# Patient Record
Sex: Male | Born: 1980 | Race: White | Hispanic: No | Marital: Married | Smoking: Never smoker
Health system: Southern US, Community
[De-identification: ages and names within clinical notes are randomized; demographics above are authoritative.]

## PROBLEM LIST (undated history)

## (undated) DIAGNOSIS — G43909 Migraine, unspecified, not intractable, without status migrainosus: Secondary | ICD-10-CM

## (undated) DIAGNOSIS — E349 Endocrine disorder, unspecified: Secondary | ICD-10-CM

## (undated) HISTORY — DX: Endocrine disorder, unspecified: E34.9

## (undated) HISTORY — DX: Migraine, unspecified, not intractable, without status migrainosus: G43.909

## (undated) HISTORY — PX: VASECTOMY: SHX75

---

## 2016-09-13 ENCOUNTER — Other Ambulatory Visit: Payer: Self-pay | Admitting: Neurology

## 2016-09-13 DIAGNOSIS — G43711 Chronic migraine without aura, intractable, with status migrainosus: Secondary | ICD-10-CM

## 2016-09-27 ENCOUNTER — Ambulatory Visit
Admission: RE | Admit: 2016-09-27 | Discharge: 2016-09-27 | Disposition: A | Discharge status: Home / Self Care | Payer: 59 | Source: Ambulatory Visit | Attending: Neurology | Admitting: Neurology

## 2016-09-27 DIAGNOSIS — G43711 Chronic migraine without aura, intractable, with status migrainosus: Secondary | ICD-10-CM | POA: Insufficient documentation

## 2016-09-27 MED ORDER — GADOBENATE DIMEGLUMINE 529 MG/ML IV SOLN
20.0000 mL | Freq: Once | INTRAVENOUS | Status: AC | PRN
  Administered 2016-09-27: 20 mL via INTRAVENOUS

## 2018-08-10 IMAGING — MR MR HEAD WO/W CM
12 series · 48 of 48 positions shown · IV contrast (20mL MULTIHANCE)
Comparison: None.

CLINICAL DATA: Intractable chronic migraine headache without [place]

EXAM:
MRI HEAD WITHOUT AND WITH CONTRAST
TECHNIQUE: Multiplanar, multiecho pulse sequences of the brain and surrounding
structures were obtained without and with intravenous contrast.
CONTRAST:  20mL MULTIHANCE GADOBENATE DIMEGLUMINE 529 MG/ML IV SOLN

[Series 2: T1 · sagittal · 5.0mm · 0.45mm/px · 1 of 27 slices shown (1 of 2)]
[im 1/27]
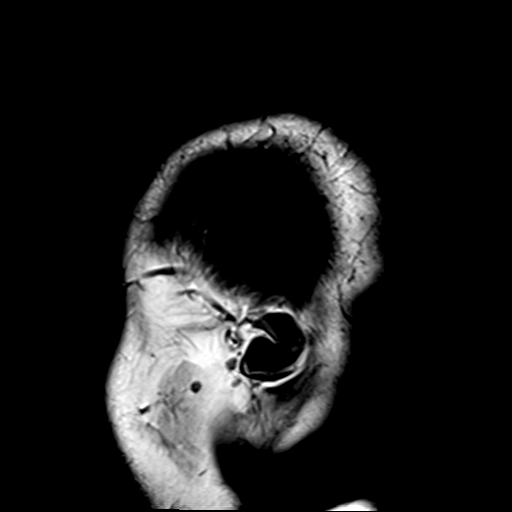

[Series 4: DWI · axial · 3.0mm · 1.80mm/px · z∈[-62,+100]mm · 4 of 55 slices shown (1 of 2)]
[im 1/55]
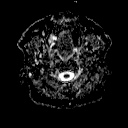
[im 19/55]
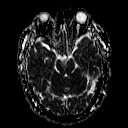
[im 37/55]
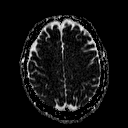
[im 55/55]
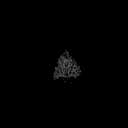

[Series 6: DWI · coronal · 3.0mm · 1.80mm/px · 3 of 46 slices shown (2 of 2)]
[im 1/46]
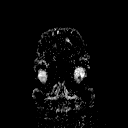
[im 23/46]
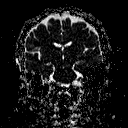
[im 46/46]
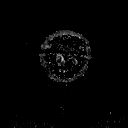

[Series 7: T2 · axial · 5.0mm · 0.60mm/px · z∈[-59,+97]mm · 2 of 25 slices shown (1 of 2)]
[im 1/25]
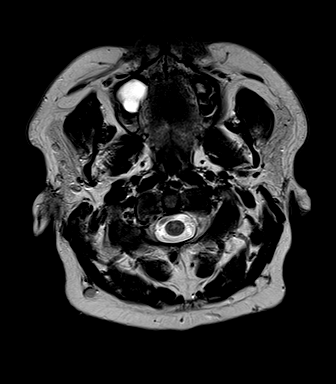
[im 25/25]
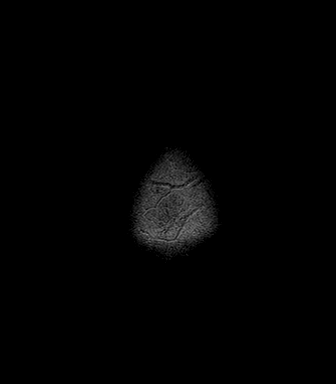

[Series 8: FLAIR · axial · 3.0mm · 0.45mm/px · z∈[-59,+97]mm · 3 of 53 slices shown]
[im 1/53]
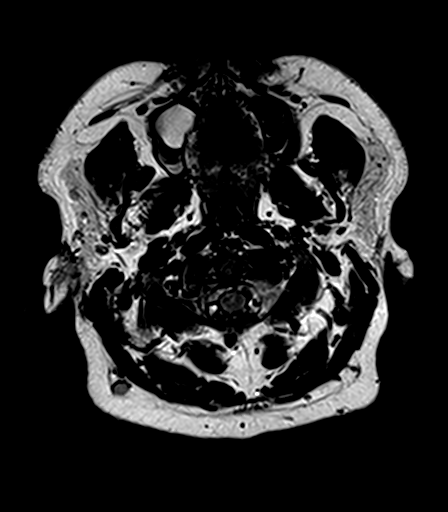
[im 27/53]
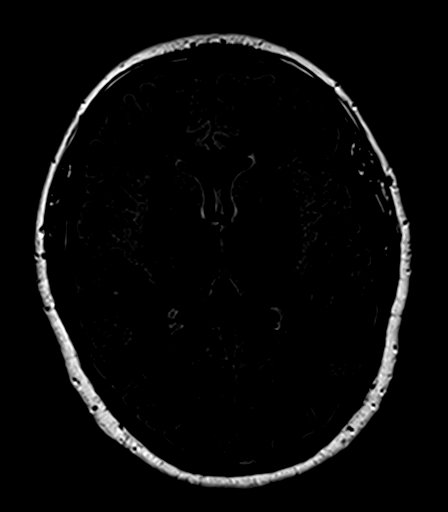
[im 53/53]
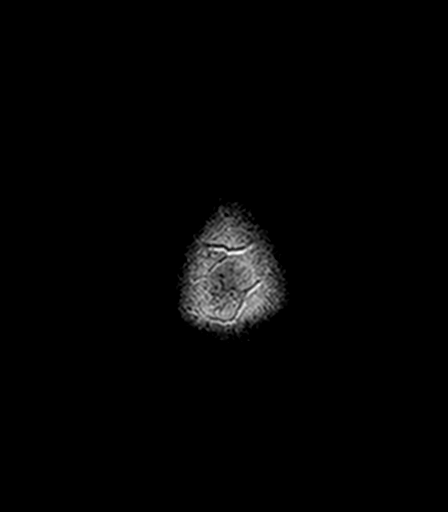

[Series 9: T1 · axial · 1.0mm · 1.00mm/px · z∈[-68,+107]mm · 11 of 176 slices shown (2 of 2)]
[im 1/176]
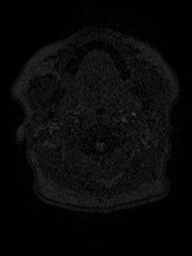
[im 18/176]
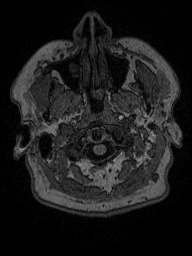
[im 36/176]
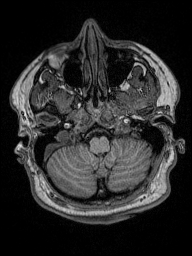
[im 53/176]
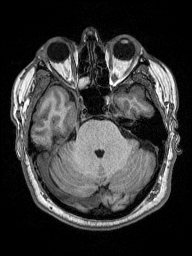
[im 71/176]
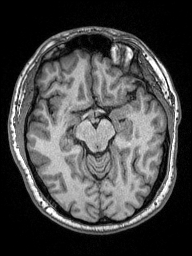
[im 88/176]
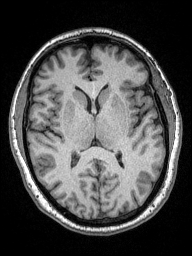
[im 106/176]
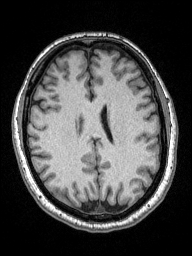
[im 123/176]
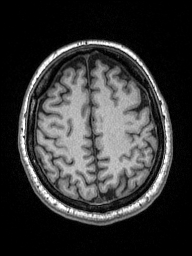
[im 141/176]
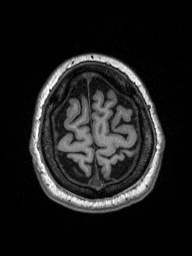
[im 158/176]
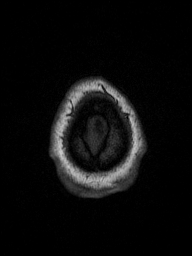
[im 176/176]
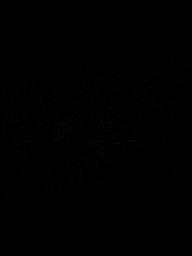

[Series 10: T2 · axial · 5.0mm · 0.45mm/px · z∈[-59,+97]mm · 2 of 25 slices shown (2 of 2)]
[im 1/25]
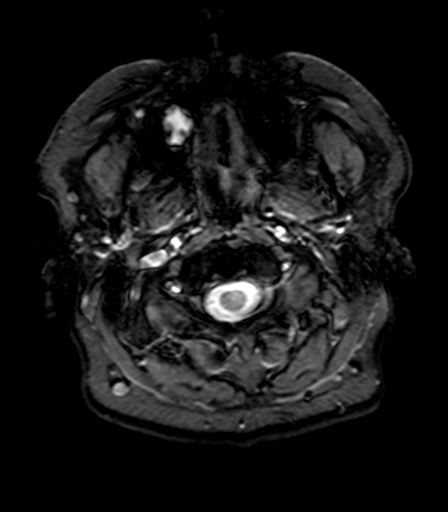
[im 25/25]
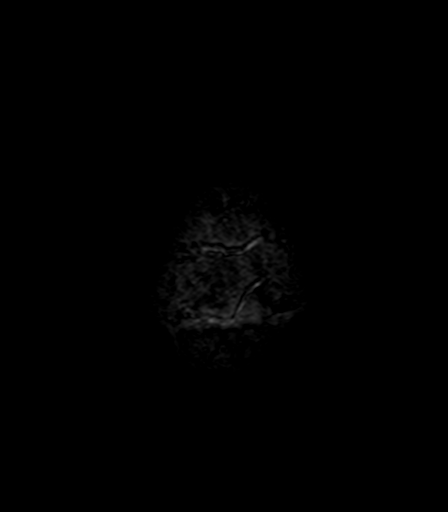

[Series 11: T2 post-contrast · coronal · 5.0mm · 0.49mm/px · 2 of 29 slices shown]
[im 1/29]
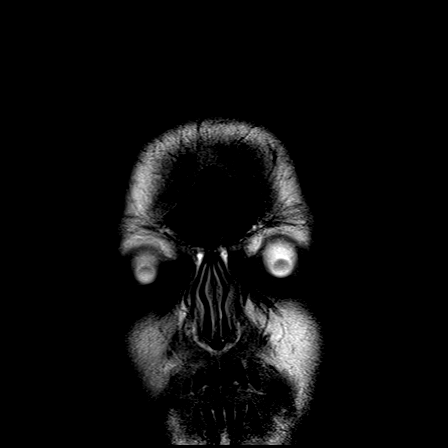
[im 29/29]
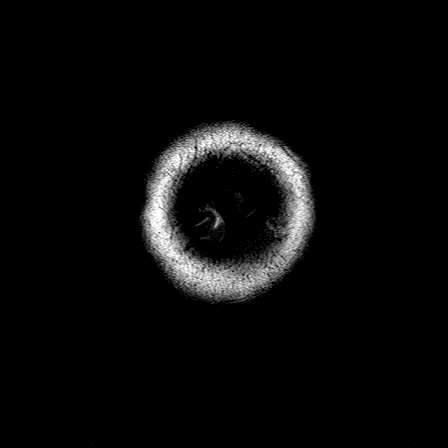

[Series 12: T1 post-contrast · axial · 1.0mm · 1.00mm/px · z∈[-68,+107]mm · 11 of 176 slices shown (1 of 2)]
[im 1/176]
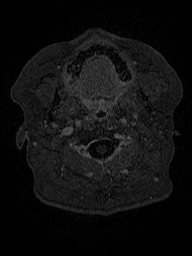
[im 18/176]
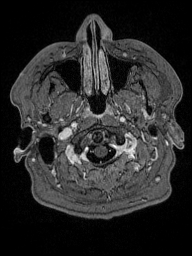
[im 36/176]
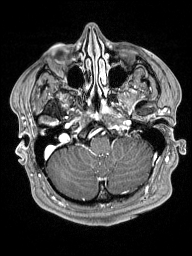
[im 53/176]
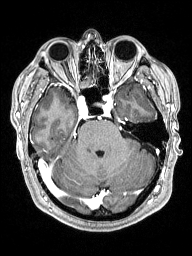
[im 71/176]
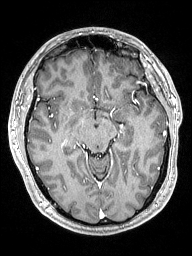
[im 88/176]
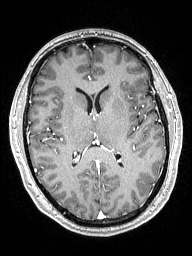
[im 106/176]
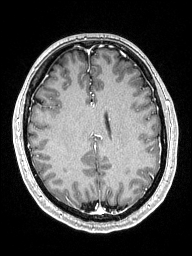
[im 123/176]
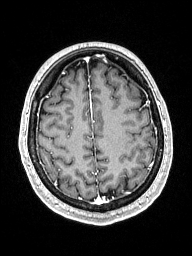
[im 141/176]
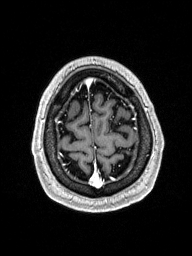
[im 158/176]
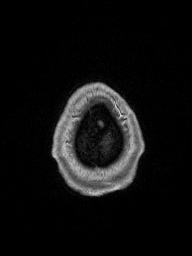
[im 176/176]
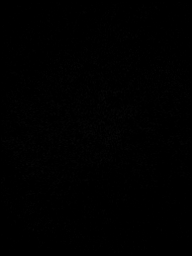

[Series 13: T1 post-contrast · coronal · 5.0mm · 0.43mm/px · 2 of 29 slices shown (2 of 2)]
[im 1/29]
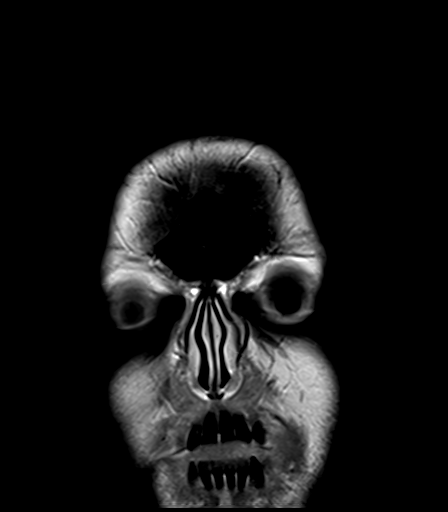
[im 29/29]
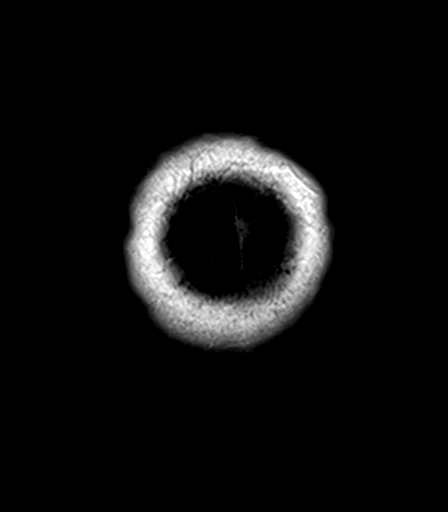

[Series 100: (id) · axial · 3.0mm · 1.80mm/px · z∈[-62,+100]mm · 4 of 55 slices shown]
[im 1/55]
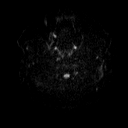
[im 19/55]
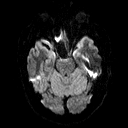
[im 37/55]
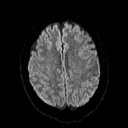
[im 55/55]
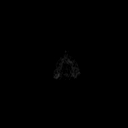

[Series 101: cor (id) · coronal · 3.0mm · 1.80mm/px · 3 of 47 slices shown]
[im 1/47]
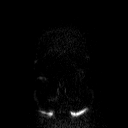
[im 24/47]
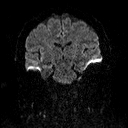
[im 47/47]
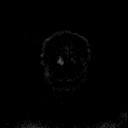

[48 of 48 positions shown; findings below may reference images not displayed]

FINDINGS: Brain: Ventricle size and cerebral volume normal. Negative for acute
or chronic infarction. Normal white matter. Negative for hemorrhage
or mass.

Normal enhancement postcontrast infusion.

Vascular: Normal arterial flow voids.  Normal venous enhancement.

Skull and upper cervical spine: Negative

Sinuses/Orbits: Mild mucosal edema right ethmoid sinus. Normal
orbit.

Other: None
IMPRESSION: Normal MRI of the brain with contrast

Mucosal edema right ethmoid sinus.

## 2020-12-14 ENCOUNTER — Encounter: Payer: Self-pay | Admitting: Urology

## 2020-12-14 ENCOUNTER — Ambulatory Visit: Payer: BC Managed Care – PPO | Admitting: Urology

## 2020-12-14 ENCOUNTER — Other Ambulatory Visit: Payer: Self-pay

## 2020-12-14 VITALS — BP 129/84 | HR 80 | Ht 69.0 in | Wt 230.0 lb

## 2020-12-14 DIAGNOSIS — Z3009 Encounter for other general counseling and advice on contraception: Secondary | ICD-10-CM

## 2020-12-14 MED ORDER — DIAZEPAM 5 MG PO TABS: 5.0000 mg | ORAL_TABLET | Freq: Once | ORAL | 0 refills | Status: DC | PRN

## 2020-12-14 NOTE — Progress Notes (Signed)
12/14/20 3:12 PM   Bobby Carter [birth year] [id number]  CC: Discuss vasectomy  HPI: I saw Bobby Carter in urology clinic today for evaluation of vasectomy.  He is a healthy 40 year old male with 2 children ages 67 and 49 and he does not desire any further biologic pregnancies.  He denies any family history of prostate cancer.   Social History:  reports that he has never smoked. He has never used smokeless tobacco. He reports current alcohol use. He reports that he does not use drugs.  Physical Exam: BP 129/84   Pulse 80   Ht 5\' 9"  (1.753 m)   Wt 230 lb (104.3 kg)   BMI 33.97 kg/m    Constitutional:  Alert and oriented, No acute distress. Cardiovascular: No clubbing, cyanosis, or edema. Respiratory: Normal respiratory effort, no increased work of breathing. GI: Abdomen is soft, nontender, nondistended, no abdominal masses GU: Circumcised phallus with patent meatus, no lesions, testicles 20 cc and descended bilaterally without masses, vas deferens easily palpable bilaterally   Assessment & Plan:   Healthy 40 year old male who desires vasectomy for permanent sterilization.  We discussed the risks and benefits of vasectomy at length.  Vasectomy is intended to be a permanent form of contraception, and does not produce immediate sterility.  Following vasectomy another form of contraception is required until vas occlusion is confirmed by a post-vasectomy semen analysis obtained 2-3 months after the procedure.  Even after vas occlusion is confirmed, vasectomy is not 100% reliable in preventing pregnancy, and the failure rate is approximately 07/1998.  Repeat vasectomy is required in less than 1% of patients.  He should refrain from ejaculation for 1 week after vasectomy.  Options for fertility after vasectomy include vasectomy reversal, and sperm retrieval with in vitro fertilization or ICSI.  These options are not always successful and may be expensive.  Finally, there are other permanent and  non-permanent alternatives to vasectomy available. There is no risk of erectile dysfunction, and the volume of semen will be similar to prior, as the majority of the ejaculate is from the prostate and seminal vesicles.   The procedure takes ~20 minutes.  We recommend patients take 5-10 mg of Valium 30 minutes prior, and he will need a driver post-procedure.  Local anesthetic is injected into the scrotal skin and a small segment of the vas deferens is removed, and the ends occluded. The complication rate is approximately 1-2%, and includes bleeding, infection, and development of chronic scrotal pain.  PLAN: Schedule vasectomy Valium sent to pharmacy   With Legrand Rams, MD 12/14/2020  [facility] [address], [address] [place], [place] [address] [phone]

## 2020-12-14 NOTE — Patient Instructions (Signed)

## 2021-01-18 ENCOUNTER — Ambulatory Visit: Payer: BC Managed Care – PPO | Admitting: Urology

## 2021-01-18 ENCOUNTER — Other Ambulatory Visit: Payer: Self-pay

## 2021-01-18 ENCOUNTER — Encounter: Payer: Self-pay | Admitting: Urology

## 2021-01-18 VITALS — BP 161/98 | HR 108 | Ht 69.0 in | Wt 230.0 lb

## 2021-01-18 DIAGNOSIS — Z302 Encounter for sterilization: Secondary | ICD-10-CM | POA: Diagnosis not present

## 2021-01-18 NOTE — Patient Instructions (Signed)
Vasectomy, Care After This sheet gives you information about how to care for yourself after your procedure. Your health care provider may also give you more specific instructions. If you have problems or questions, contact your health careprovider. What can I expect after the procedure? After the procedure, it is common to have: Mild pain, swelling, or discomfort in your scrotum or redness on your scrotum. Some blood coming from your incisions or puncture sites for 1 or 2 days. Blood in your semen. Follow these instructions at home: Medicines Take over-the-counter and prescription medicines only as told by your health care provider. Avoid taking any medicines that contain aspirin or NSAIDs, such as ibuprofen. These medicines can make bleeding worse. Activity For the first 2 days after surgery, avoid physical activity and exercise that requires a lot of energy. Ask your health care provider what activities are safe for you. Do not take part in sports or perform heavy physical labor until your pain has improved, or until your health care provider says it is okay. You may have limits on the amount of weight you can lift as told by your health care provider. Do not ejaculate for at least 1 week after the procedure, or for as long as you are told. You may resume sexual activity 7-10 days after your procedure, or when your health care provider approves. Use a different method of birth control (contraception) until you have had test results that confirm that there is no sperm in your semen. Scrotal support Use scrotal support, such as a jockstrap or underwear with a supportive pouch, as needed for 1 week after your procedure. If you feel discomfort in your scrotum, you may remove the scrotal support to see if the discomfort is relieved. Sometimes scrotal support can press on the scrotum and cause or worsen discomfort. If your skin gets irritated, you may add some germ-free (sterile), fluffed bandages or  a clean washcloth to the scrotal support. Managing pain and swelling If directed, put ice on the affected area. To do this: Put ice in a plastic bag. Place a towel between your skin and the bag. Leave the ice on for 20 minutes, 2-3 times a day. Remove the ice if your skin turns bright red. This is very important. If you cannot feel pain, heat, or cold, you have a greater risk of damage to the area.  General instructions  Check your incisions or puncture sites every day for signs of infection. Check for: Redness, swelling, or more pain. Fluid or blood. Warmth. Pus or a bad smell. Leave stitches (sutures) in place. The sutures will dissolve on their own and do not need to be removed. Keep all follow-up visits. This is important because you will need a test to confirm that there is no sperm in your semen. Multiple ejaculations are needed to clear out sperm that were beyond the vasectomy site. You will need one test result showing that there is no sperm in your semen before you can resume unprotected sex. This may take 2-4 months after your procedure. If you were given a sedative during the procedure, it can affect you for several hours. Do not drive or operate machinery until your health care provider says that it is safe.  Contact a health care provider if: You have redness, swelling, or more pain around an incision or puncture site, or in your scrotum area. You have bleeding from an incision or puncture site. You have pus or a bad smell coming from an incision   or puncture site. You have a fever. An incision or puncture site opens up. Get help right away if: You develop a rash. You have trouble breathing. Summary After your procedure, it is common to have mild pain, swelling, redness, or discomfort in your scrotum. For the first 2 days after surgery, avoid physical activity and exercise that requires a lot of energy. Put ice on the affected area. Leave the ice on for 20 minutes, 2-3  times a day. If you were given a sedative during the procedure, it can affect you for several hours. Do not drive or operate machinery until your health care provider says that it is safe. This information is not intended to replace advice given to you by your health care provider. Make sure you discuss any questions you have with your healthcare provider. Document Revised: 11/12/2019 Document Reviewed: 11/12/2019 Elsevier Patient Education  2022 [employer] Inc[employer]  

## 2021-01-18 NOTE — Progress Notes (Signed)
VASECTOMY PROCEDURE NOTE:  The patient was taken to the minor procedure room and placed in the supine position. His genitals were prepped and draped in the usual sterile fashion. The right vas deferens was brought up to the skin of the right upper scrotum. The skin overlying it was anesthetized with 1% lidocaine without epinephrine, anesthetic was also injected alongside the vas deferens in the direction of the inguinal canal. The no scalpel vasectomy instrument was used to make a small perforation in the scrotal skin. The vasectomy clamp was used to grasp the vas deferens. It was carefully dissected free from surrounding structures. A 1cm segment of the vas was removed, and the cut ends of the mucosa were cauterized.  No significant bleeding was noted. The vas deferens was returned to the scrotum. The skin incision was closed with a simple interrupted stitch of 4-0 chromic.  Attention was then turned to the left side. The left vasectomy was performed in the same exact fashion. Sterile dressings were placed over each incision. The patient tolerated the procedure well.  IMPRESSION/DIAGNOSIS: The patient is a 40 year old gentleman who underwent a vasectomy today. Post-procedure instructions were reviewed. I stressed the importance of continuing to use birth control until he provides a semen specimen more than 2 months from now that demonstrates azoospermia.  We discussed return precautions including fever over 101, significant bleeding or hematoma, or uncontrolled pain. I also stressed the importance of avoiding strenuous activity for one week, no sexual activity or ejaculations for 5 days, intermittent icing over the next 48 hours, and scrotal support.   PLAN: The patient will be advised of his semen analysis results when available.  Legrand Rams, MD 01/18/2021

## 2021-04-12 ENCOUNTER — Other Ambulatory Visit: Payer: Self-pay

## 2021-04-12 ENCOUNTER — Other Ambulatory Visit: Payer: BC Managed Care – PPO

## 2021-04-12 DIAGNOSIS — R5383 Other fatigue: Secondary | ICD-10-CM | POA: Diagnosis not present

## 2021-04-13 LAB — TESTOSTERONE: Testosterone: 175 ng/dL — ABNORMAL LOW (ref 264–916)

## 2021-04-14 ENCOUNTER — Other Ambulatory Visit: Payer: Self-pay

## 2021-04-14 ENCOUNTER — Other Ambulatory Visit: Payer: BC Managed Care – PPO

## 2021-04-14 DIAGNOSIS — R7989 Other specified abnormal findings of blood chemistry: Secondary | ICD-10-CM | POA: Diagnosis not present

## 2021-04-14 NOTE — Addendum Note (Signed)
Addended by: Veneta Penton on: 04/14/2021 08:52 AM   Modules accepted: Orders

## 2021-04-15 LAB — TESTOSTERONE: Testosterone: 228 ng/dL — ABNORMAL LOW (ref 264–916)

## 2021-04-15 LAB — PSA: Prostate Specific Ag, Serum: 0.6 ng/mL (ref 0.0–4.0)

## 2021-04-15 LAB — LUTEINIZING HORMONE: LH: 2.3 m[IU]/mL (ref 1.7–8.6)

## 2021-04-15 LAB — HEMOGLOBIN AND HEMATOCRIT, BLOOD
Hematocrit: 42.4 % (ref 37.5–51.0)
Hemoglobin: 13.7 g/dL (ref 13.0–17.7)

## 2021-04-20 NOTE — Progress Notes (Signed)
04/21/2021 9:08 AM   Bobby Carter July 28, 1980 [id number]  Referring provider: No referring provider defined for this encounter.  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 male who presents today for low testosterone.  He has not noticed any improvement with diet and exercise in fatigue or weight loss after eighteen months.    He has no urinary complaints at this time.  Patient denies any modifying or aggravating factors.  Patient denies any gross hematuria, dysuria or suprapubic/flank pain.  Patient denies any fevers, chills, nausea or vomiting.    IPSS     Row Name 04/21/21 0800         International Prostate Symptom Score   How often have you had the sensation of not emptying your bladder? Not at All     How often have you had to urinate less than every two hours? Less than 1 in 5 times     How often have you found you stopped and started again several times when you urinated? Not at All     How often have you found it difficult to postpone urination? Not at All     How often have you had a weak urinary stream? Less than 1 in 5 times     How often have you had to strain to start urination? Not at All     How many times did you typically get up at night to urinate? None     Total IPSS Score 2           Quality of Life due to urinary symptoms   If you were to spend the rest of your life with your urinary condition just the way it is now how would you feel about that? Delighted              Score:  1-7 Mild 8-19 Moderate 20-35 Severe     Patient still having spontaneous erections on occasions.  He denies any pain or curvature with erections.     SHIM     Row Name 04/21/21 [phone]         SHIM: Over the last 6 months:   How do you rate your confidence that you could get and keep an erection? Very High      When you had erections with sexual stimulation, how often were your erections hard enough for penetration (entering your partner)? Almost Always or Always     During sexual intercourse, how often were you able to maintain your erection after you had penetrated (entered) your partner? Almost Always or Always     During sexual intercourse, how difficult was it to maintain your erection to completion of intercourse? Not Difficult     When you attempted sexual intercourse, how often was it satisfactory for you? Almost Always or Always           SHIM Total Score   SHIM 25              Score: 1-7 Severe ED 8-11 Moderate ED 12-16 Mild-Moderate ED 17-21 Mild ED 22-25 No ED        PMH: History reviewed. No pertinent past medical history.  Surgical History: Past Surgical History:  Procedure Laterality Date   VASECTOMY      Home Medications:  Allergies as of 04/21/2021   No  Known Allergies      Medication List        Accurate as of April 21, 2021  9:08 AM. If you have any questions, ask your nurse or doctor.          STOP taking these medications    diazepam 5 MG tablet Commonly known as: Valium Stopped by: Michiel Cowboy, PA-C       TAKE these medications    Testosterone 20.25 MG/1.25GM (1.62%) Gel Place 2 Act onto the skin daily. Started by: Michiel Cowboy, PA-C        Allergies: No Known Allergies  Family History: History reviewed. No pertinent family history.  Social History:  reports that he has never smoked. He has never used smokeless tobacco. He reports current alcohol use. He reports that he does not use drugs.  ROS: Pertinent ROS in HPI  Physical Exam: BP (!) 149/83   Pulse 68   Ht 5\' 9"  (1.753 m)   Wt 230 lb (104.3 kg)   BMI 33.97 kg/m   Constitutional:  Well nourished. Alert and oriented, No acute distress. HEENT: [place] AT, mask in place.  Trachea midline Cardiovascular: No clubbing, cyanosis, or edema. Respiratory:  Normal respiratory effort, no increased work of breathing. Neurologic: Grossly intact, no focal deficits, moving all 4 extremities. Psychiatric: Normal mood and affect.  Laboratory Data: Lab Results  Component Value Date   HGB 13.7 04/14/2021   HCT 42.4 04/14/2021   Component     Latest Ref Rng & Units 04/12/2021 04/14/2021  Testosterone     264 - 916 ng/dL 562 (L) 130 (L)   Component     Latest Ref Rng & Units 04/14/2021  Prostate Specific Ag, Serum     0.0 - 4.0 ng/mL 0.6  I have reviewed the labs.   Pertinent Imaging: CLINICAL DATA:  Intractable chronic migraine headache without aura   EXAM: MRI HEAD WITHOUT AND WITH CONTRAST   TECHNIQUE: Multiplanar, multiecho pulse sequences of the brain and surrounding structures were obtained without and with intravenous contrast.   CONTRAST:  20mL MULTIHANCE GADOBENATE DIMEGLUMINE 529 MG/ML IV SOLN   COMPARISON:  None.   FINDINGS: Brain: Ventricle size and cerebral volume normal. Negative for acute or chronic infarction. Normal white matter. Negative for hemorrhage or mass.   Normal enhancement postcontrast infusion.   Vascular: Normal arterial flow voids.  Normal venous enhancement.   Skull and upper cervical spine: Negative   Sinuses/Orbits: Mild mucosal edema right ethmoid sinus. Normal orbit.   Other: None   IMPRESSION: Normal MRI of the brain with contrast   Mucosal edema right ethmoid sinus.     Electronically Signed   By: Marlan Palau M.D.   On: 09/27/2016 10:51    Assessment & Plan:    1. Testosterone deficiency -met criteria of current guidelines from the AUA reports the diagnosis of hypogonadism requires a morning serum total testosterone level at least 2 days apart that is below 300 ng/dL - If the testosterones levels return below 300 ng/dL we will need to do additional blood work [facility] - if that returns below normal or low normal we will need to add a prolactin) - if that blood work is abnormal - we  will need to refer to endocrinology - Advised the patient that low testosterone levels is a risk factor for CVD -Potential side effects of testosterone replacement were discussed including stimulation of benign prostatic growth with lower urinary tract symptoms; erythrocytosis; edema; gynecomastia; worsening sleep apnea; venous thromboembolism;  testicular atrophy and infertility. Recent studies suggesting an increased incidence of heart attack and stroke in patients taking testosterone was discussed. He was informed there is conflicting evidence regarding the impact of testosterone therapy on cardiovascular risk. The theoretical risk of growth stimulation of an undetected prostate cancer was also discussed.  He was informed that current evidence does not provide any definitive answers regarding the risks of testosterone therapy on prostate cancer and cardiovascular disease. The need for periodic monitoring of his testosterone level, PSA, hematocrit and DRE was discussed. - I also discussed that some men have had success using clomid for hypogonadism.  It does seem to be more successful in younger men, but there are incidences of good results in middle-aged men.  I explained that it is used in male infertility to stimulate the testicles to make more testosterone/sperm.  There has been no long term data on side effects, but some urologists has been having success with this medication.  -Significant symptoms -Recommend starting TRT -We discussed the most common forms of replacement including intramuscular injection and gels and he desires to start gel -discussed transference issues with gel  -Rx for gel sent to pharmacy Meds ordered this encounter  Medications   Testosterone 20.25 MG/1.25GM (1.62%) GEL    Sig: Place 2 Act onto the skin daily.    Dispense:  175 g    Refill:  3    Order Specific Question:   Supervising Provider    Answer:   Vanna Scotland [[id number]]    -Follow-up 4 weeks after starting  TRT for testosterone level and symptom check    2. Undesired fertility  -patient brought in post-vas specimen today   Return in about 1 month (around 05/22/2021) for Testosterone level only.  These notes generated with voice recognition software. I apologize for typographical errors.  Michiel Cowboy, PA-C  [facility] [address]  [address] [place], [place] [address] [phone]

## 2021-04-21 ENCOUNTER — Ambulatory Visit (INDEPENDENT_AMBULATORY_CARE_PROVIDER_SITE_OTHER): Payer: BC Managed Care – PPO | Admitting: Urology

## 2021-04-21 ENCOUNTER — Other Ambulatory Visit: Payer: Self-pay

## 2021-04-21 ENCOUNTER — Other Ambulatory Visit: Payer: BC Managed Care – PPO

## 2021-04-21 ENCOUNTER — Encounter: Payer: Self-pay | Admitting: Urology

## 2021-04-21 VITALS — BP 149/83 | HR 68 | Ht 69.0 in | Wt 230.0 lb

## 2021-04-21 DIAGNOSIS — Z302 Encounter for sterilization: Secondary | ICD-10-CM | POA: Diagnosis not present

## 2021-04-21 DIAGNOSIS — E349 Endocrine disorder, unspecified: Secondary | ICD-10-CM | POA: Diagnosis not present

## 2021-04-21 MED ORDER — TESTOSTERONE 20.25 MG/1.25GM (1.62%) TD GEL: 2.0000 | Freq: Every day | TRANSDERMAL | 3 refills | Status: DC

## 2021-04-22 LAB — POST-VAS SPERM EVALUATION,QUAL: Volume: 2.6 mL

## 2021-04-26 ENCOUNTER — Telehealth: Payer: Self-pay

## 2021-04-26 ENCOUNTER — Telehealth: Payer: Self-pay | Admitting: *Deleted

## 2021-04-26 ENCOUNTER — Other Ambulatory Visit: Payer: Self-pay | Admitting: Urology

## 2021-04-26 DIAGNOSIS — E349 Endocrine disorder, unspecified: Secondary | ICD-10-CM

## 2021-04-26 NOTE — Telephone Encounter (Signed)
Pt was having problems getting rx filled because [facility] needs to know what to dispense. Spoke with [facility] again and the sig on the prescription must say "pump" on the prescription.  Just FYI

## 2021-04-26 NOTE — Telephone Encounter (Signed)
What's the difference? Isn't how the pharmacy dispense it???

## 2021-04-26 NOTE — Telephone Encounter (Signed)
See my chart message

## 2021-04-26 NOTE — Telephone Encounter (Signed)
-----   Message from Sondra Come, MD sent at 04/26/2021  8:16 AM EDT ----- No sperm seen, okay to discontinue alternative contraception

## 2021-05-19 ENCOUNTER — Other Ambulatory Visit: Payer: BC Managed Care – PPO

## 2021-05-22 ENCOUNTER — Other Ambulatory Visit: Payer: BC Managed Care – PPO

## 2021-05-22 ENCOUNTER — Other Ambulatory Visit: Payer: Self-pay

## 2021-05-22 DIAGNOSIS — E349 Endocrine disorder, unspecified: Secondary | ICD-10-CM | POA: Diagnosis not present

## 2021-05-23 LAB — TESTOSTERONE: Testosterone: 441 ng/dL (ref 264–916)

## 2021-09-04 ENCOUNTER — Other Ambulatory Visit: Payer: Self-pay | Admitting: Urology

## 2021-09-04 DIAGNOSIS — E349 Endocrine disorder, unspecified: Secondary | ICD-10-CM

## 2021-09-06 ENCOUNTER — Other Ambulatory Visit: Payer: Self-pay | Admitting: *Deleted

## 2021-09-06 DIAGNOSIS — E349 Endocrine disorder, unspecified: Secondary | ICD-10-CM

## 2021-09-07 ENCOUNTER — Other Ambulatory Visit: Payer: Self-pay

## 2021-09-07 ENCOUNTER — Other Ambulatory Visit: Payer: BC Managed Care – PPO

## 2021-09-07 DIAGNOSIS — E349 Endocrine disorder, unspecified: Secondary | ICD-10-CM | POA: Diagnosis not present

## 2021-09-08 LAB — HEMOGLOBIN AND HEMATOCRIT, BLOOD
Hematocrit: 46 % (ref 37.5–51.0)
Hemoglobin: 15 g/dL (ref 13.0–17.7)

## 2021-09-08 LAB — TESTOSTERONE: Testosterone: 377 ng/dL (ref 264–916)

## 2021-09-08 LAB — PSA: Prostate Specific Ag, Serum: 0.7 ng/mL (ref 0.0–4.0)

## 2021-09-12 NOTE — Progress Notes (Signed)
? ? ?09/13/2021 ?10:44 AM  ? ?Bobby Carter ?02-17-1981 ?[id number] ? ?Referring provider: No referring provider defined for this encounter. ? ?Chief Complaint  ?Patient presents with  ? Follow-up  ?  Testosterone deficiency   ? ?Urological history: ?1. Undesired infertility ?-s/p vasectomy 01/18/2021  ?-post vas specimen negative for sperm ? ?2. Testosterone deficiency ?-contributing factors of age ?-testosterone level 377 09/2021 ?-H & H WNL 09/2021 ?-managed with testosterone gel (1.62%), 2 pumps daily  ? ? ?HPI: ?Bobby Carter is a 41 y.o. male who presents today for testosterone deficiency.   ? ?He has no urinary complaints at this visit.  Patient denies any modifying or aggravating factors.  Patient denies any gross hematuria, dysuria or suprapubic/flank pain.  Patient denies any fevers, chills, nausea or vomiting.   ? IPSS   ? ? Row Name 09/13/21 1000  ?  ?  ?  ? International Prostate Symptom Score  ? How often have you had the sensation of not emptying your bladder? Less than 1 in 5    ? How often have you had to urinate less than every two hours? Not at All    ? How often have you found you stopped and started again several times when you urinated? Not at All    ? How often have you found it difficult to postpone urination? Not at All    ? How often have you had a weak urinary stream? Not at All    ? How often have you had to strain to start urination? Not at All    ? How many times did you typically get up at night to urinate? None    ? Total IPSS Score 1    ?  ? Quality of Life due to urinary symptoms  ? If you were to spend the rest of your life with your urinary condition just the way it is now how would you feel about that? Delighted    ? ?  ?  ? ?  ? ? ?Score:  ?1-7 Mild ?8-19 Moderate ?20-35 Severe  ? ?Patient still having spontaneous erections.  He denies any pain or curvature with erections.   ? SHIM   ? ? Row Name 09/13/21 1027  ?  ?  ?  ? SHIM: Over the last 6 months:  ? How do you rate your  confidence that you could get and keep an erection? Very High    ? When you had erections with sexual stimulation, how often were your erections hard enough for penetration (entering your partner)? Almost Always or Always    ? During sexual intercourse, how often were you able to maintain your erection after you had penetrated (entered) your partner? Almost Always or Always    ? During sexual intercourse, how difficult was it to maintain your erection to completion of intercourse? Not Difficult    ? When you attempted sexual intercourse, how often was it satisfactory for you? Almost Always or Always    ?  ? SHIM Total Score  ? SHIM 25    ? ?  ?  ? ?  ? ? ? ?Score: ?1-7 Severe ED ?8-11 Moderate ED ?12-16 Mild-Moderate ED ?17-21 Mild ED ?22-25 No ED  ? ? ?   ? ?PMH: ?No past medical history on file. ? ?Surgical History: ?Past Surgical History:  ?Procedure Laterality Date  ? VASECTOMY    ? ? ?Home Medications:  ?Allergies as of 09/13/2021   ?No Known Allergies ?  ? ?  ?  Medication List  ?  ? ?  ? Accurate as of September 13, 2021 10:44 AM. If you have any questions, ask your nurse or doctor.  ?  ?  ? ?  ? ?Testosterone 20.25 MG/1.25GM (1.62%) Gel ?Place 2 Act onto the skin daily. ?  ? ?  ? ? ?Allergies: No Known Allergies ? ?Family History: ?No family history on file. ? ?Social History:  reports that he has never smoked. He has never used smokeless tobacco. He reports current alcohol use. He reports that he does not use drugs. ? ?ROS: ?Pertinent ROS in HPI ? ?Physical Exam: ?BP (!) 148/80   Pulse 92   Ht 5\' 9"  (1.753 m)   Wt 230 lb (104.3 kg)   SpO2 97%   BMI 33.97 kg/m?   ?Constitutional:  Well nourished. Alert and oriented, No acute distress. ?HEENT: [place] AT, mask in place.  Trachea midline ?Cardiovascular: No clubbing, cyanosis, or edema. ?Respiratory: Normal respiratory effort, no increased work of breathing. ?Neurologic: Grossly intact, no focal deficits, moving all 4 extremities. ?Psychiatric: Normal mood and affect.   ? ?Laboratory Data: ?Lab Results  ?Component Value Date  ? HGB 15.0 09/07/2021  ? HCT 46.0 09/07/2021  ? ?Component ?    Latest Ref Rng & Units 04/14/2021 09/07/2021  ?Prostate Specific Ag, Serum ?    0.0 - 4.0 ng/mL 0.6 0.7  ? ?Component ?    Latest Ref Rng & Units 09/07/2021  ?Testosterone ?    264 - 916 ng/dL 952  ?I have reviewed the labs. ? ? ?Pertinent Imaging: ?N/A ? ?Assessment & Plan:   ? ?1. Testosterone deficiency  ?-testosterone levels subtherapeutic at this time, but patient is experiencing increased energy and confidence and more spontaneous erections so we will have him apply the gel in different areas rather than increasing the dose ?-continue AndroGel 1.62% 2 pumps daily-refill sent to pharmacy ? ?Return in about 6 months (around 03/16/2022) for PSA, testosterone, H & H, IPSS, SHIM and exam. ? ?These notes generated with voice recognition software. I apologize for typographical errors. ? ?Shakiyah Cirilo, PA-C ? ?[facility] ?[address]  [address] ?[place], [place] [address] ?[phone] ?  ?

## 2021-09-13 ENCOUNTER — Encounter: Payer: Self-pay | Admitting: Urology

## 2021-09-13 ENCOUNTER — Ambulatory Visit: Payer: BC Managed Care – PPO | Admitting: Urology

## 2021-09-13 ENCOUNTER — Other Ambulatory Visit: Payer: Self-pay

## 2021-09-13 VITALS — BP 148/80 | HR 92 | Ht 69.0 in | Wt 230.0 lb

## 2021-09-13 DIAGNOSIS — E349 Endocrine disorder, unspecified: Secondary | ICD-10-CM

## 2021-09-13 MED ORDER — TESTOSTERONE 20.25 MG/1.25GM (1.62%) TD GEL: 2.0000 | Freq: Every day | TRANSDERMAL | 3 refills | Status: DC

## 2021-09-13 NOTE — Patient Instructions (Signed)
Follow up here in 6 months. ? ? ? ?Please call and ask to speak with a nurse if you develop questions or concerns. ? ?

## 2022-01-21 ENCOUNTER — Other Ambulatory Visit: Payer: Self-pay | Admitting: Urology

## 2022-01-21 DIAGNOSIS — E349 Endocrine disorder, unspecified: Secondary | ICD-10-CM

## 2022-03-15 ENCOUNTER — Other Ambulatory Visit: Payer: BC Managed Care – PPO

## 2022-03-15 ENCOUNTER — Other Ambulatory Visit: Payer: Self-pay | Admitting: *Deleted

## 2022-03-15 DIAGNOSIS — E349 Endocrine disorder, unspecified: Secondary | ICD-10-CM

## 2022-03-16 LAB — TESTOSTERONE: Testosterone: 975 ng/dL — ABNORMAL HIGH (ref 264–916)

## 2022-03-16 LAB — HEMOGLOBIN AND HEMATOCRIT, BLOOD
Hematocrit: 44.9 % (ref 37.5–51.0)
Hemoglobin: 14.9 g/dL (ref 13.0–17.7)

## 2022-03-16 LAB — PSA: Prostate Specific Ag, Serum: 0.8 ng/mL (ref 0.0–4.0)

## 2022-03-21 NOTE — Progress Notes (Signed)
03/22/2022 9:15 AM   Bobby Carter 06/23/1981 [id number]  Referring provider: No referring provider defined for this encounter.  Urological history: 1. 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 Testosterone deficiency -contributing factors of age -testosterone (03/2022) 975 -Hemoglobin/hematocrit (03/2022) 14.9/44.9 -testosterone gel (1.62%), 2 pumps daily   3. Prostate cancer screening -PSA (03/2022) 0.8 -offered prostate cancer screening to the patient explaining that individuals w/ black ancestry, germline mutations, strong family history of prostate cancer and individuals receiving TRT per AUA 2023 guidelines   HPI: Bobby Carter is a 41 y.o. male who presents today for testosterone deficiency.    I PSS 3/0  Patient denies any modifying or aggravating factors.  Patient denies any gross hematuria, dysuria or suprapubic/flank pain.  Patient denies any fevers, chills, nausea or vomiting.     IPSS     Row Name 03/22/22 0800         International Prostate Symptom Score   How often have you had the sensation of not emptying your bladder? Less than 1 in 5     How often have you had to urinate less than every two hours? Less than 1 in 5 times     How often have you found you stopped and started again several times when you urinated? Not at All     How often have you found it difficult to postpone urination? Not at All     How often have you had a weak urinary stream? Less than 1 in 5 times     How often have you had to strain to start urination? Not at All     How many times did you typically get up at night to urinate? None     Total IPSS Score 3       Quality of Life due to urinary symptoms   If you were to spend the rest of your life with your urinary condition just the way it is now how would you feel about that? Delighted               Score:  1-7 Mild 8-19 Moderate 20-35 Severe   SHIM 25  Patient still  having spontaneous erections.  He denies any pain or curvature with erections.     SHIM     Row Name 03/22/22 0849         SHIM: Over the last 6 months:   How do you rate your confidence that you could get and keep an erection? Very High     When you had erections with sexual stimulation, how often were your erections hard enough for penetration (entering your partner)? Almost Always or Always     During sexual intercourse, how often were you able to maintain your erection after you had penetrated (entered) your partner? Almost Always or Always     During sexual intercourse, how difficult was it to maintain your erection to completion of intercourse? Not Difficult     When you attempted sexual intercourse, how often was it satisfactory for you? Almost Always or Always       SHIM Total Score   SHIM 25                Score: 1-7 Severe ED 8-11 Moderate ED 12-16 Mild-Moderate ED 17-21 Mild ED 22-25 No ED        PMH: No past medical history on file.  Surgical History: Past Surgical History:  Procedure  Laterality Date   VASECTOMY      Home Medications:  Allergies as of 03/22/2022   No Known Allergies      Medication List        Accurate as of March 22, 2022  9:15 AM. If you have any questions, ask your nurse or doctor.          Testosterone 20.25 MG/ACT (1.62%) Gel PLACE 2 ACT ONTO THE SKIN DAILY.        Allergies: No Known Allergies  Family History: No family history on file.  Social History:  reports that he has never smoked. He has never used smokeless tobacco. He reports current alcohol use. He reports that he does not use drugs.  ROS: Pertinent ROS in HPI  Physical Exam: BP (!) 163/83   Pulse 96   Ht 5\' 9"  (1.753 m)   Wt 233 lb (105.7 kg)   BMI 34.41 kg/m   Constitutional:  Well nourished. Alert and oriented, No acute distress. HEENT: [place] AT, moist mucus membranes.  Trachea midline Cardiovascular: No clubbing, cyanosis, or  edema. Respiratory: Normal respiratory effort, no increased work of breathing. Neurologic: Grossly intact, no focal deficits, moving all 4 extremities. Psychiatric: Normal mood and affect.   Laboratory Data: Lab Results  Component Value Date   HGB 14.9 03/15/2022   HCT 44.9 03/15/2022   Component     Latest Ref Rng & Units 04/14/2021 09/07/2021  Prostate Specific Ag, Serum     0.0 - 4.0 ng/mL 0.6 0.7   Component     Latest Ref Rng & Units 09/07/2021  Testosterone     264 - 916 ng/dL 782  I have reviewed the labs.   Pertinent Imaging: N/A  Assessment & Plan:    1. Testosterone deficiency  -testosterone levels are therapeutic -H & H WNL  -continue Testosterone 20.25 MG/ACT (1.62%) GEL, 2 pumps daily   2. Prostate cancer screening -PSA normal, but just slightly above median PSA for age group -offered prostate cancer screening to the patient explaining that individuals w/ black ancestry, germline mutations, strong family history of prostate cancer and individuals receiving TRT per AUA 2023 guidelines  -continue biannual screening   Return in about 6 months (around 09/20/2022) for PSA, testosterone and  H & H.  These notes generated with voice recognition software. I apologize for typographical errors.  Cloretta Ned  [facility] [address]  [address] [place], [place] [address] [phone]

## 2022-03-22 ENCOUNTER — Encounter: Payer: Self-pay | Admitting: Urology

## 2022-03-22 ENCOUNTER — Other Ambulatory Visit: Payer: Self-pay

## 2022-03-22 ENCOUNTER — Ambulatory Visit: Payer: BC Managed Care – PPO | Admitting: Urology

## 2022-03-22 VITALS — BP 163/83 | HR 96 | Ht 69.0 in | Wt 233.0 lb

## 2022-03-22 DIAGNOSIS — Z125 Encounter for screening for malignant neoplasm of prostate: Secondary | ICD-10-CM | POA: Diagnosis not present

## 2022-03-22 DIAGNOSIS — E291 Testicular hypofunction: Secondary | ICD-10-CM | POA: Diagnosis not present

## 2022-03-22 DIAGNOSIS — E349 Endocrine disorder, unspecified: Secondary | ICD-10-CM

## 2022-03-22 NOTE — Patient Instructions (Addendum)
Dr. Sharin Grave [phone]  [facility] [address]  [place], [place] [address] [phone]  [facility] [address] [place], [place] [address] [phone]  [facility] [address], [address] [place], [place] [address] [phone]

## 2022-04-04 ENCOUNTER — Telehealth: Payer: Self-pay | Admitting: General Practice

## 2022-04-04 NOTE — Telephone Encounter (Signed)
Yes, I spoke with Larene Beach from urology and agreed to take patient.  Please schedule him for new patient office visit anytime at his convenience.

## 2022-04-04 NOTE — Telephone Encounter (Signed)
Patient called and stated that Dr. Zara Council had talk to NP Carlis Abbott about having an appointment for new patient appointment and he thought it was tomorrow but there is no future appointments in the appt desk. Call back number [phone].

## 2022-04-04 NOTE — Telephone Encounter (Signed)
I spoke to [place] while patient on the phone made sure patient was aware he does not have appointment with our office at all. Advised that we are sending message to Anda Kraft to verify if they have spoke and she has agreed to take on as new patient. He is aware that we will call and let him know if we are able to set up appointment.

## 2022-04-05 ENCOUNTER — Encounter: Payer: Self-pay | Admitting: Primary Care

## 2022-04-05 ENCOUNTER — Ambulatory Visit: Payer: BC Managed Care – PPO | Admitting: Primary Care

## 2022-04-05 VITALS — BP 142/82 | HR 88 | Temp 98.6°F | Ht 69.0 in | Wt 235.0 lb

## 2022-04-05 DIAGNOSIS — I10 Essential (primary) hypertension: Secondary | ICD-10-CM | POA: Insufficient documentation

## 2022-04-05 DIAGNOSIS — Z0000 Encounter for general adult medical examination without abnormal findings: Secondary | ICD-10-CM | POA: Diagnosis not present

## 2022-04-05 DIAGNOSIS — R030 Elevated blood-pressure reading, without diagnosis of hypertension: Secondary | ICD-10-CM | POA: Diagnosis not present

## 2022-04-05 DIAGNOSIS — Z1159 Encounter for screening for other viral diseases: Secondary | ICD-10-CM

## 2022-04-05 DIAGNOSIS — Z114 Encounter for screening for human immunodeficiency virus [HIV]: Secondary | ICD-10-CM | POA: Diagnosis not present

## 2022-04-05 DIAGNOSIS — E349 Endocrine disorder, unspecified: Secondary | ICD-10-CM | POA: Insufficient documentation

## 2022-04-05 NOTE — Patient Instructions (Signed)
Schedule a lab appointment to return fasting 4 hours for labs.  Continue to monitor your blood pressure, notify me if you begin to see readings at or above 130/90.  It was a pleasure to meet you today! Please don't hesitate to contact me with any questions. Welcome to [facility]!  Preventive Care 42-42 Years Old, Male Preventive care refers to lifestyle choices and visits with your health care provider that can promote health and wellness. Preventive care visits are also called wellness exams. What can I expect for my preventive care visit? Counseling During your preventive care visit, your health care provider may ask about your: Medical history, including: Past medical problems. Family medical history. Current health, including: Emotional well-being. Home life and relationship well-being. Sexual activity. Lifestyle, including: Alcohol, nicotine or tobacco, and drug use. Access to firearms. Diet, exercise, and sleep habits. Safety issues such as seatbelt and bike helmet use. Sunscreen use. Work and work Astronomer. Physical exam Your health care provider will check your: Height and weight. These may be used to calculate your BMI (body mass index). BMI is a measurement that tells if you are at a healthy weight. Waist circumference. This measures the distance around your waistline. This measurement also tells if you are at a healthy weight and may help predict your risk of certain diseases, such as type 2 diabetes and high blood pressure. Heart rate and blood pressure. Body temperature. Skin for abnormal spots. What immunizations do I need?  Vaccines are usually given at various ages, according to a schedule. Your health care provider will recommend vaccines for you based on your age, medical history, and lifestyle or other factors, such as travel or where you work. What tests do I need? Screening Your health care provider may recommend screening tests for certain conditions. This  may include: Lipid and cholesterol levels. Diabetes screening. This is done by checking your blood sugar (glucose) after you have not eaten for a while (fasting). Hepatitis B test. Hepatitis C test. HIV (human immunodeficiency virus) test. STI (sexually transmitted infection) testing, if you are at risk. Lung cancer screening. Prostate cancer screening. Colorectal cancer screening. Talk with your health care provider about your test results, treatment options, and if necessary, the need for more tests. Follow these instructions at home: Eating and drinking  Eat a diet that includes fresh fruits and vegetables, whole grains, lean protein, and low-fat dairy products. Take vitamin and mineral supplements as recommended by your health care provider. Do not drink alcohol if your health care provider tells you not to drink. If you drink alcohol: Limit how much you have to 0-2 drinks a day. Know how much alcohol is in your drink. In the [place] one drink equals one 12 oz bottle of beer (355 mL), one 5 oz glass of wine (148 mL), or one 1 oz glass of hard liquor (44 mL). Lifestyle Brush your teeth every morning and night with fluoride toothpaste. Floss one time each day. Exercise for at least 30 minutes 5 or more days each week. Do not use any products that contain nicotine or tobacco. These products include cigarettes, chewing tobacco, and vaping devices, such as e-cigarettes. If you need help quitting, ask your health care provider. Do not use drugs. If you are sexually active, practice safe sex. Use a condom or other form of protection to prevent STIs. Take aspirin only as told by your health care provider. Make sure that you understand how much to take and what form to take. Work with  your health care provider to find out whether it is safe and beneficial for you to take aspirin daily. Find healthy ways to manage stress, such as: Meditation, yoga, or listening to music. Journaling. Talking to  a trusted person. Spending time with friends and family. Minimize exposure to UV radiation to reduce your risk of skin cancer. Safety Always wear your seat belt while driving or riding in a vehicle. Do not drive: If you have been drinking alcohol. Do not ride with someone who has been drinking. When you are tired or distracted. While texting. If you have been using any mind-altering substances or drugs. Wear a helmet and other protective equipment during sports activities. If you have firearms in your house, make sure you follow all gun safety procedures. What's next? Go to your health care provider once a year for an annual wellness visit. Ask your health care provider how often you should have your eyes and teeth checked. Stay up to date on all vaccines. This information is not intended to replace advice given to you by your health care provider. Make sure you discuss any questions you have with your health care provider. Document Revised: 12/21/2020 Document Reviewed: 12/21/2020 Elsevier Patient Education  [place].

## 2022-04-05 NOTE — Progress Notes (Signed)
Subjective:    Patient ID: Bobby Carter, male    DOB: 1980/09/08, 41 y.o.   MRN: [id number]  HPI  Bobby Carter is a very pleasant 41 y.o. male who presents today who presents today to establish care and for complete physical.  1) Testosterone Deficiency: Following with Urology and is managed on testosterone 20.25% gel daily. Last office visit was in September 2023, no changes made to regimen. History of vasectomy in 2022.  2) Elevated BP: No prior diagnosis of hypertension. He is checking BP at home which runs 110's/80's. Today he was at the gym working out before his visit. He also drank a pre-work out drink which has caffeine.   He chest pain, dizziness, headaches.   BP Readings from Last 3 Encounters:  04/05/22 (!) 142/82  03/22/22 (!) 163/83  09/13/21 (!) 148/80    Immunizations: -Tetanus: Unsure, declines  -Influenza: Declines   Diet: Fair diet.  Exercise: Regular exercise several days weekly at the gym.  Eye exam: Completes annually  Dental exam: Completes semi-annually    PSA: UTD       Review of Systems  Constitutional:  Negative for unexpected weight change.  HENT:  Negative for rhinorrhea.   Respiratory:  Negative for cough and shortness of breath.   Cardiovascular:  Negative for chest pain.  Gastrointestinal:  Negative for constipation and diarrhea.  Genitourinary:  Negative for difficulty urinating.  Musculoskeletal:  Negative for arthralgias and myalgias.  Skin:  Negative for rash.  Allergic/Immunologic: Negative for environmental allergies.  Neurological:  Negative for dizziness and headaches.  Psychiatric/Behavioral:  The patient is not nervous/anxious.          Past Medical History:  Diagnosis Date   Migraines    Testosterone deficiency     Social History   Socioeconomic History   Marital status: Married    Spouse name: Verlie Topper   Number of children: 2   Years of education: Not on file   Highest education level: Not on file   Occupational History   Occupation: Firefighter    Comment: [employer]  Tobacco Use   Smoking status: Never   Smokeless tobacco: Never  Substance and Sexual Activity   Alcohol use: Yes    Alcohol/week: 2.0 standard drinks of alcohol    Types: 2 Cans of beer per week   Drug use: Never   Sexual activity: Yes    Birth control/protection: Surgical    Comment: Vasectomy  Other Topics Concern   Not on file  Social History Narrative   Not on file   Social Determinants of Health   Financial Resource Strain: Not on file  Food Insecurity: Not on file  Transportation Needs: Not on file  Physical Activity: Not on file  Stress: Not on file  Social Connections: Not on file  Intimate Partner Violence: Not on file    Past Surgical History:  Procedure Laterality Date   VASECTOMY      Family History  Problem Relation Age of Onset   Hypertension Mother    Hypertension Father    Diabetes Maternal Grandfather     No Known Allergies  Current Outpatient Medications on File Prior to Visit  Medication Sig Dispense Refill   Testosterone 20.25 MG/ACT (1.62%) GEL PLACE 2 ACT ONTO THE SKIN DAILY. 175 g 1   No current facility-administered medications on file prior to visit.    BP (!) 142/82   Pulse 88   Temp 98.6 F (37 C) (Temporal)  Ht 5\' 9"  (1.753 m)   Wt 235 lb (106.6 kg)   SpO2 97%   BMI 34.70 kg/m  Objective:   Physical Exam HENT:     Right Ear: Tympanic membrane and ear canal normal.     Left Ear: Tympanic membrane and ear canal normal.     Nose: Nose normal.     Right Sinus: No maxillary sinus tenderness or frontal sinus tenderness.     Left Sinus: No maxillary sinus tenderness or frontal sinus tenderness.  Eyes:     Conjunctiva/sclera: Conjunctivae normal.  Neck:     Thyroid: No thyromegaly.     Vascular: No carotid bruit.  Cardiovascular:     Rate and Rhythm: Normal rate and regular rhythm.     Heart sounds: Normal heart sounds.   Pulmonary:     Effort: Pulmonary effort is normal.     Breath sounds: Normal breath sounds. No wheezing or rales.  Abdominal:     General: Bowel sounds are normal.     Palpations: Abdomen is soft.     Tenderness: There is no abdominal tenderness.  Musculoskeletal:        General: Normal range of motion.     Cervical back: Neck supple.  Skin:    General: Skin is warm and dry.  Neurological:     Mental Status: He is alert and oriented to person, place, and time.     Cranial Nerves: No cranial nerve deficit.     Deep Tendon Reflexes: Reflexes are normal and symmetric.  Psychiatric:        Mood and Affect: Mood normal.           Assessment & Plan:   Problem List Items Addressed This Visit       Other   Preventative health care - Primary    Declines tetanus and influenza vaccines. PSA UTD.  Discussed the importance of a healthy diet and regular exercise in order for weight loss, and to reduce the risk of further co-morbidity.  Exam stable. Labs pending.  Follow up in 1 year for repeat physical.       Relevant Orders   Lipid panel   Comprehensive metabolic panel   Elevated blood pressure reading in office without diagnosis of hypertension    Several documented elevated readings, discussed with patient. Strong FH of hypertension in both parents.  Commended him on lifestyle changes. Home readings are at goal. Encouraged to continue monitoring and to notify if levels increase. Parameters provided.       Relevant Orders   Lipid panel   Comprehensive metabolic panel   Testosterone deficiency    Following with Urology, office notes reviewed from September 2023. Continue testosterone gel daily.      Other Visit Diagnoses     Screening for HIV (human immunodeficiency virus)       Relevant Orders   HIV Antibody (routine testing w rflx)   Encounter for hepatitis C screening test for low risk patient       Relevant Orders   Hepatitis C Antibody           Doreene Nest, NP

## 2022-04-05 NOTE — Assessment & Plan Note (Signed)
Declines tetanus and influenza vaccines. PSA UTD.  Discussed the importance of a healthy diet and regular exercise in order for weight loss, and to reduce the risk of further co-morbidity.  Exam stable. Labs pending.  Follow up in 1 year for repeat physical.

## 2022-04-05 NOTE — Assessment & Plan Note (Signed)
Following with Urology, office notes reviewed from September 2023. Continue testosterone gel daily.

## 2022-04-05 NOTE — Assessment & Plan Note (Signed)
Several documented elevated readings, discussed with patient. Strong FH of hypertension in both parents.  Commended him on lifestyle changes. Home readings are at goal. Encouraged to continue monitoring and to notify if levels increase. Parameters provided.

## 2022-04-11 ENCOUNTER — Other Ambulatory Visit: Payer: Self-pay | Admitting: Primary Care

## 2022-04-11 ENCOUNTER — Other Ambulatory Visit (INDEPENDENT_AMBULATORY_CARE_PROVIDER_SITE_OTHER): Payer: BC Managed Care – PPO

## 2022-04-11 DIAGNOSIS — Z1159 Encounter for screening for other viral diseases: Secondary | ICD-10-CM

## 2022-04-11 DIAGNOSIS — Z0000 Encounter for general adult medical examination without abnormal findings: Secondary | ICD-10-CM | POA: Diagnosis not present

## 2022-04-11 DIAGNOSIS — Z114 Encounter for screening for human immunodeficiency virus [HIV]: Secondary | ICD-10-CM

## 2022-04-11 DIAGNOSIS — R030 Elevated blood-pressure reading, without diagnosis of hypertension: Secondary | ICD-10-CM | POA: Diagnosis not present

## 2022-04-11 DIAGNOSIS — R7309 Other abnormal glucose: Secondary | ICD-10-CM

## 2022-04-11 LAB — COMPREHENSIVE METABOLIC PANEL
ALT: 36 U/L (ref 0–53)
AST: 22 U/L (ref 0–37)
Albumin: 4.5 g/dL (ref 3.5–5.2)
Alkaline Phosphatase: 48 U/L (ref 39–117)
BUN: 19 mg/dL (ref 6–23)
CO2: 26 mEq/L (ref 19–32)
Calcium: 9 mg/dL (ref 8.4–10.5)
Chloride: 104 mEq/L (ref 96–112)
Creatinine, Ser: 1.03 mg/dL (ref 0.40–1.50)
GFR: 89.83 mL/min (ref >60.00)
Glucose, Bld: 109 mg/dL — ABNORMAL HIGH (ref 70–99)
Potassium: 3.9 mEq/L (ref 3.5–5.1)
Sodium: 139 mEq/L (ref 135–145)
Total Bilirubin: 0.5 mg/dL (ref 0.2–1.2)
Total Protein: 7.1 g/dL (ref 6.0–8.3)

## 2022-04-11 LAB — LIPID PANEL
Cholesterol: 152 mg/dL (ref 0–200)
HDL: 33.7 mg/dL — ABNORMAL LOW (ref >39.00)
LDL Cholesterol: 86 mg/dL (ref 0–99)
NonHDL: 118.31
Total CHOL/HDL Ratio: 5
Triglycerides: 164 mg/dL — ABNORMAL HIGH (ref 0.0–149.0)
VLDL: 32.8 mg/dL (ref 0.0–40.0)

## 2022-04-12 ENCOUNTER — Other Ambulatory Visit (INDEPENDENT_AMBULATORY_CARE_PROVIDER_SITE_OTHER): Payer: BC Managed Care – PPO

## 2022-04-12 DIAGNOSIS — R7309 Other abnormal glucose: Secondary | ICD-10-CM | POA: Diagnosis not present

## 2022-04-12 LAB — POCT GLYCOSYLATED HEMOGLOBIN (HGB A1C): Hemoglobin A1C: 5.5 % (ref 4.0–5.6)

## 2022-04-12 LAB — HIV ANTIBODY (ROUTINE TESTING W REFLEX): HIV 1&2 Ab, 4th Generation: NONREACTIVE

## 2022-04-12 LAB — HEPATITIS C ANTIBODY: Hepatitis C Ab: NONREACTIVE

## 2022-07-12 ENCOUNTER — Other Ambulatory Visit: Payer: Self-pay | Admitting: Family Medicine

## 2022-07-12 DIAGNOSIS — E349 Endocrine disorder, unspecified: Secondary | ICD-10-CM

## 2022-07-12 MED ORDER — TESTOSTERONE 20.25 MG/ACT (1.62%) TD GEL: TRANSDERMAL | 1 refills | Status: DC

## 2022-09-20 ENCOUNTER — Other Ambulatory Visit: Payer: BC Managed Care – PPO

## 2022-09-20 DIAGNOSIS — E349 Endocrine disorder, unspecified: Secondary | ICD-10-CM

## 2022-09-21 ENCOUNTER — Telehealth: Payer: Self-pay | Admitting: Family Medicine

## 2022-09-21 LAB — TESTOSTERONE: Testosterone: 604 ng/dL (ref 264–916)

## 2022-09-21 LAB — HEMOGLOBIN AND HEMATOCRIT, BLOOD
Hematocrit: 47.1 % (ref 37.5–51.0)
Hemoglobin: 15.1 g/dL (ref 13.0–17.7)

## 2022-09-21 LAB — PSA: Prostate Specific Ag, Serum: 0.8 ng/mL (ref 0.0–4.0)

## 2022-09-21 NOTE — Telephone Encounter (Signed)
Patient notified and voiced understanding. He states he is on Gel so he uses it daily. I have him scheduled in 6 months for OV and labs.

## 2022-09-21 NOTE — Telephone Encounter (Signed)
-----   Message from Nori Riis, PA-C sent at 09/21/2022  8:17 AM EDT ----- Please let Bobby Carter know that his labs were normal.  I will need to see him again in 6 months for an office visit with blood work prior.  The blood work will be a testosterone level midway between injections, hemoglobin, hematocrit and PSA.

## 2022-12-24 NOTE — Progress Notes (Unsigned)
He presents today for Testopel insertion.  Patient is placed on the exam table in the *** lateral jackknife position.  Identified upper outer quadrant of hip for insertion; prepped area with *** and injected *** cc's of Lidocaine 1% with Epinephrine to anesthetize superficially and distally along trocar tract.  Made 3 mm incision using 11 blade of scalpel; trocar with sharp ended stylet was inserted into subcutaneous tissue in line with femur. Sharp stylet was withdrawn and *** pellets were placed into trocar well. Testopel pellets advanced into tissue using blunt ended stylet. Trocar removed and incision closed using *** Steri-Strips. Cleansed area to remove *** and covered Steri-Strips with outer Band-Aid.  Careful inspection of insertion is done and patient informed of post procedure instructions.  Advised patient to apply ice to the site for 20-30 minutes every hour if needed.  Avoid hot tubes, swimming or full water immersion of the insertion site for 72 hours.  Bandage may be removed after one week.    Patient is advised to contact the office if experiencing drainage of the insertion site, excessive redness or swelling of the site, chills and/or fevers > 101.5, nausea or vomiting, dizziness or lightheadedness and excessive tenderness.  Avoid strenuous activity and heavy lifting for 72 hours.     He will return in *** month for serum testosterone.

## 2022-12-25 ENCOUNTER — Ambulatory Visit: Payer: BC Managed Care – PPO | Admitting: Urology

## 2022-12-25 ENCOUNTER — Encounter: Payer: Self-pay | Admitting: Urology

## 2022-12-25 VITALS — BP 130/80 | HR 74 | Ht 71.0 in | Wt 225.0 lb

## 2022-12-25 DIAGNOSIS — E291 Testicular hypofunction: Secondary | ICD-10-CM

## 2022-12-25 DIAGNOSIS — E349 Endocrine disorder, unspecified: Secondary | ICD-10-CM

## 2022-12-25 MED ORDER — TESTOSTERONE 75 MG IL PLLT
75.0000 mg | PELLET | Freq: Once | Status: AC
  Administered 2022-12-25: 75 mg

## 2022-12-25 NOTE — Patient Instructions (Signed)
Contact the office if experiencing drainage of the insertion site, excessive redness or swelling of the site, chills and/or fevers > 101.5, nausea or vomiting, dizziness or lightheadedness and excessive tenderness.  Avoid strenuous activity and heavy lifting for 72 hours.

## 2023-01-23 ENCOUNTER — Encounter: Payer: Self-pay | Admitting: Primary Care

## 2023-01-23 ENCOUNTER — Telehealth: Payer: Self-pay | Admitting: Primary Care

## 2023-01-23 ENCOUNTER — Ambulatory Visit: Payer: BC Managed Care – PPO | Admitting: Primary Care

## 2023-01-23 VITALS — BP 140/82 | HR 70 | Temp 97.4°F | Ht 71.0 in | Wt 242.0 lb

## 2023-01-23 DIAGNOSIS — H9311 Tinnitus, right ear: Secondary | ICD-10-CM | POA: Diagnosis not present

## 2023-01-23 NOTE — Telephone Encounter (Signed)
error 

## 2023-01-23 NOTE — Patient Instructions (Signed)
Try using Flonase (fluticasone) nasal spray. Instill 1 spray in each nostril twice daily.   You can also add in Zyrtec 10 mg at bedtime.  Keep an eye on your blood pressure for me.  Please schedule a physical to meet with me in 3 months.   It was a pleasure to see you today!

## 2023-01-23 NOTE — Progress Notes (Signed)
Subjective:    Patient ID: Bobby Carter, male    DOB: 11-Dec-1980, 42 y.o.   MRN: [id number]  HPI  Bobby Carter is a very pleasant 42 y.o. male with a history of testosterone deficiency, elevated blood pressure reading who presents today to discuss decreased hearing.  Four mornings ago he woke up noticing decreased ability to hear with ringing to the right ear. Prior to symptom onset he was in [place] for the weekend. Since then his symptoms have continued.   He denies ear pain, swelling, rhinorrhea, cough, post nasal drip, recent recurrent exposure to loud noises. 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   He denies a family history of congenital hearing disorder. He's not taken anything OTC for symptoms.    Review of Systems  Constitutional:  Negative for fever.  HENT:  Positive for hearing loss and tinnitus. Negative for congestion, ear pain and postnasal drip.   Respiratory:  Negative for cough.   Neurological:  Negative for dizziness and headaches.         Past Medical History:  Diagnosis Date   Migraines    Testosterone deficiency     Social History   Socioeconomic History   Marital status: Married    Spouse name: Azzan Navas   Number of children: 2   Years of education: Not on file   Highest education level: Bachelor's degree (e.g., BA, AB, BS)  Occupational History   Occupation: Firefighter    Comment: [employer]  Tobacco Use   Smoking status: Never   Smokeless tobacco: Never  Substance and Sexual Activity   Alcohol use: Yes    Alcohol/week: 2.0 standard drinks of alcohol    Types: 2 Cans of beer per week   Drug use: Never   Sexual activity: Yes    Birth control/protection: Surgical    Comment: Vasectomy  Other Topics Concern   Not on file  Social History Narrative   Not on file   Social Determinants of Health   Financial Resource Strain: Low Risk  (01/22/2023)   Overall Financial Resource  Strain (CARDIA)    Difficulty of Paying Living Expenses: Not hard at all  Food Insecurity: No Food Insecurity (01/22/2023)   Hunger Vital Sign    Worried About Running Out of Food in the Last Year: Never true    Ran Out of Food in the Last Year: Never true  Transportation Needs: No Transportation Needs (01/22/2023)   PRAPARE - Administrator, Civil Service (Medical): No    Lack of Transportation (Non-Medical): No  Physical Activity: Sufficiently Active (01/22/2023)   Exercise Vital Sign    Days of Exercise per Week: 6 days    Minutes of Exercise per Session: 70 min  Stress: No Stress Concern Present (01/22/2023)   [employer] of Occupational Health - Occupational Stress Questionnaire    Feeling of Stress : Not at all  Social Connections: Moderately Integrated (01/22/2023)   Social Connection and Isolation Panel [NHANES]    Frequency of Communication with Friends and Family: Twice a week    Frequency of Social Gatherings with Friends and Family: Once a week    Attends Religious Services: Never    Database administrator or Organizations: Yes    Attends Engineer, structural: More than 4 times per year    Marital Status: Married  Catering manager Violence: Not on file    Past Surgical History:  Procedure Laterality Date   VASECTOMY      Family History  Problem Relation Age of Onset   Hypertension Mother    Hypertension Father    Diabetes Maternal Grandfather     No Known Allergies  Current Outpatient Medications on File Prior to Visit  Medication Sig Dispense Refill   Testosterone 20.25 MG/ACT (1.62%) GEL PLACE 2 ACT ONTO THE SKIN DAILY. (Patient not taking: Reported on 01/23/2023) 175 g 1   No current facility-administered medications on file prior to visit.    BP (!) 140/82   Pulse 70   Temp (!) 97.4 F (36.3 C) (Temporal)   Ht 5\' 11"  (1.803 m)   Wt 242 lb (109.8 kg)   SpO2 98%   BMI 33.75 kg/m  Objective:   Physical Exam Constitutional:       Appearance: He is not ill-appearing.  HENT:     Right Ear: Tympanic membrane, ear canal and external ear normal. There is no impacted cerumen.     Left Ear: Tympanic membrane, ear canal and external ear normal. There is no impacted cerumen.     Ears:     Comments: Trace amount of fluid behind bilateral TMs. Cardiovascular:     Rate and Rhythm: Normal rate.  Pulmonary:     Effort: Pulmonary effort is normal.  Neurological:     Mental Status: He is alert.           Assessment & Plan:  Tinnitus of right ear Assessment & Plan: Exam today grossly negative except for trace fluid.  Start Flonase nasal spray twice daily. Add Zyrtec 10 mg at bedtime if needed.  Consider prednisone course.  Consider audiology referral.  He will update in 1 week.          Doreene Nest, NP

## 2023-01-23 NOTE — Assessment & Plan Note (Signed)
Exam today grossly negative except for trace fluid.  Start Flonase nasal spray twice daily. Add Zyrtec 10 mg at bedtime if needed.  Consider prednisone course.  Consider audiology referral.  He will update in 1 week.

## 2023-01-24 ENCOUNTER — Other Ambulatory Visit: Payer: BC Managed Care – PPO

## 2023-01-24 ENCOUNTER — Other Ambulatory Visit: Payer: Self-pay

## 2023-01-24 DIAGNOSIS — E349 Endocrine disorder, unspecified: Secondary | ICD-10-CM | POA: Diagnosis not present

## 2023-01-25 LAB — TESTOSTERONE: Testosterone: 375 ng/dL (ref 264–916)

## 2023-01-30 DIAGNOSIS — H9311 Tinnitus, right ear: Secondary | ICD-10-CM

## 2023-01-31 ENCOUNTER — Encounter: Payer: Self-pay | Admitting: *Deleted

## 2023-02-21 ENCOUNTER — Other Ambulatory Visit: Payer: Self-pay | Admitting: Urology

## 2023-02-21 DIAGNOSIS — E349 Endocrine disorder, unspecified: Secondary | ICD-10-CM

## 2023-02-21 MED ORDER — TESTOSTERONE 20.25 MG/ACT (1.62%) TD GEL: TRANSDERMAL | 1 refills | Status: DC

## 2023-02-28 DIAGNOSIS — H9121 Sudden idiopathic hearing loss, right ear: Secondary | ICD-10-CM | POA: Diagnosis not present

## 2023-02-28 DIAGNOSIS — H6981 Other specified disorders of Eustachian tube, right ear: Secondary | ICD-10-CM | POA: Diagnosis not present

## 2023-02-28 DIAGNOSIS — H9311 Tinnitus, right ear: Secondary | ICD-10-CM | POA: Diagnosis not present

## 2023-03-22 DIAGNOSIS — H9041 Sensorineural hearing loss, unilateral, right ear, with unrestricted hearing on the contralateral side: Secondary | ICD-10-CM | POA: Diagnosis not present

## 2023-03-25 ENCOUNTER — Other Ambulatory Visit: Payer: Self-pay

## 2023-03-25 DIAGNOSIS — E349 Endocrine disorder, unspecified: Secondary | ICD-10-CM

## 2023-03-26 ENCOUNTER — Other Ambulatory Visit: Payer: BC Managed Care – PPO

## 2023-03-28 ENCOUNTER — Ambulatory Visit: Payer: BC Managed Care – PPO | Admitting: Family Medicine

## 2023-03-28 ENCOUNTER — Encounter: Payer: Self-pay | Admitting: Family Medicine

## 2023-03-28 VITALS — BP 124/80 | HR 85 | Temp 96.0°F | Ht 71.0 in | Wt 242.8 lb

## 2023-03-28 DIAGNOSIS — H6591 Unspecified nonsuppurative otitis media, right ear: Secondary | ICD-10-CM | POA: Diagnosis not present

## 2023-03-28 DIAGNOSIS — J209 Acute bronchitis, unspecified: Secondary | ICD-10-CM | POA: Diagnosis not present

## 2023-03-28 HISTORY — DX: Unspecified nonsuppurative otitis media, right ear: H65.91

## 2023-03-28 MED ORDER — AMOXICILLIN 500 MG PO CAPS: 1000.0000 mg | ORAL_CAPSULE | Freq: Two times a day (BID) | ORAL | 0 refills | Status: DC

## 2023-03-28 MED ORDER — PREDNISONE 20 MG PO TABS: ORAL_TABLET | ORAL | 0 refills | Status: DC

## 2023-03-28 MED ORDER — ALBUTEROL SULFATE HFA 108 (90 BASE) MCG/ACT IN AERS: 2.0000 | INHALATION_SPRAY | Freq: Four times a day (QID) | RESPIRATORY_TRACT | 0 refills | Status: DC | PRN

## 2023-03-28 NOTE — Progress Notes (Signed)
Patient ID: Bobby Carter, male    DOB: February 10, 1981, 42 y.o.   MRN: [id number]  This visit was conducted in person.  There were no vitals taken for this visit.   CC:  Chief Complaint  Patient presents with   rsv    Pt complains of 2 weeks ago, sore throat, sinus headache, coughing up mucus, no fever.     Subjective:   HPI: Bobby Carter is a 42 y.o. male presenting on 03/28/2023 for rsv (Pt complains of 2 weeks ago, sore throat, sinus headache, coughing up mucus, no fever. )   Date of onset:  2 week ago Initial symptoms included  ST x 1-2 days,  Symptoms progressed to sinus congestion and sinus headache.  Productive cough.  No SOB but has coughing fits deep breathing.  No fever. Yesterday decreased hearing in right ear, ringing as well   No wheeze but occ noise in throat from mucus.  Sick contacts:  no COVID testing:   none     He has tried to treat with ibuprofen tylenol, flonase, OTC sudafed.    No history of chronic lung disease such as asthma or COPD. Non-smoker.  2 months ago Had issues with hearing in right ear.     Relevant past medical, surgical, family and social history reviewed and updated as indicated. Interim medical history since our last visit reviewed. Allergies and medications reviewed and updated. Outpatient Medications Prior to Visit  Medication Sig Dispense Refill   Testosterone 20.25 MG/ACT (1.62%) GEL PLACE 2 ACT ONTO THE SKIN DAILY. 175 g 1   No facility-administered medications prior to visit.     Per HPI unless specifically indicated in ROS section below Review of Systems  Constitutional:  Negative for fatigue and fever.  HENT:  Positive for congestion and ear pain.   Eyes:  Negative for pain.  Respiratory:  Positive for cough. Negative for shortness of breath.   Cardiovascular:  Negative for chest pain, palpitations and leg swelling.  Gastrointestinal:  Negative for abdominal pain.  Genitourinary:  Negative for dysuria.  Musculoskeletal:   Negative for arthralgias.  Neurological:  Positive for headaches. Negative for syncope and light-headedness.  Psychiatric/Behavioral:  Negative for dysphoric mood.    Objective:  There were no vitals taken for this visit.  Wt Readings from Last 3 Encounters:  01/23/23 242 lb (109.8 kg)  12/25/22 225 lb (102.1 kg)  04/05/22 235 lb (106.6 kg)      Physical Exam Constitutional:      Appearance: He is well-developed.  HENT:     Head: Normocephalic.     Right Ear: Hearing normal. A middle ear effusion is present. Tympanic membrane is injected, erythematous and bulging.     Left Ear: Hearing normal. Tympanic membrane is injected.     Nose: Nose normal.     Right Turbinates: Enlarged.     Left Turbinates: Enlarged.     Right Sinus: No maxillary sinus tenderness or frontal sinus tenderness.     Left Sinus: No maxillary sinus tenderness or frontal sinus tenderness.  Neck:     Thyroid: No thyroid mass or thyromegaly.     Vascular: No carotid bruit.     Trachea: Trachea normal.  Cardiovascular:     Rate and Rhythm: Normal rate and regular rhythm.     Pulses: Normal pulses.     Heart sounds: Heart sounds not distant. No murmur heard.    No friction rub. No gallop.     Comments: No  peripheral edema Pulmonary:     Effort: Pulmonary effort is normal. No respiratory distress.     Breath sounds: Normal breath sounds.     Comments: Hacky cough with deep breathing Skin:    General: Skin is warm and dry.     Findings: No rash.  Psychiatric:        Speech: Speech normal.        Behavior: Behavior normal.        Thought Content: Thought content normal.       Results for orders placed or performed in visit on 01/24/23  Testosterone  Result Value Ref Range   Testosterone 375 264 - 916 ng/dL    Assessment and Plan  There are no diagnoses linked to this encounter.  No follow-ups on file.   Kerby Nora, MD

## 2023-03-28 NOTE — Assessment & Plan Note (Signed)
Acute, treat with amoxicillin 500 mg p.o. twice daily x 10 days.  Return and ER precautions provided

## 2023-03-28 NOTE — Assessment & Plan Note (Signed)
Acute, likely initial viral infection now with bacterial superinfection. No benefit or change in treatment with testing so held off on evaluation for specific virus. Given [place] cough and likely bronchospasm: Will treat with albuterol inhaler 2 puffs every 4-6 hours as needed as well as with a prednisone taper.

## 2023-03-29 ENCOUNTER — Ambulatory Visit: Payer: BC Managed Care – PPO | Admitting: Urology

## 2023-04-03 ENCOUNTER — Other Ambulatory Visit: Payer: BC Managed Care – PPO

## 2023-04-03 DIAGNOSIS — E349 Endocrine disorder, unspecified: Secondary | ICD-10-CM | POA: Diagnosis not present

## 2023-04-04 LAB — HEMOGLOBIN AND HEMATOCRIT, BLOOD
Hematocrit: 46.7 % (ref 37.5–51.0)
Hemoglobin: 15.2 g/dL (ref 13.0–17.7)

## 2023-04-04 LAB — PSA: Prostate Specific Ag, Serum: 0.8 ng/mL (ref 0.0–4.0)

## 2023-04-04 NOTE — Telephone Encounter (Signed)
I am not sure, his ear did look infected when I saw him so that is likely why some of the symptoms are better with antibiotics.  Wonder if he has underlying allergies.  May be try antihistamine.  If still not getting better may be follow-up for reevaluation.  Would you want me to message him or would you like to since he addressed it to you?

## 2023-04-04 NOTE — Telephone Encounter (Signed)
Amy, any further recommendations?

## 2023-04-08 NOTE — Progress Notes (Unsigned)
04/09/2023 9:04 AM   Bobby Carter [birth year] [id number]  Referring provider: Doreene Nest, NP [address] [place],  [place] [address]  Urological history: 1. 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 Testosterone deficiency -contributing factors of age -testosterone level pending -Hemoglobin/hematocrit (03/2023) 15.2/46.7 -testosterone gel (1.62%), 2 pumps daily   3. Prostate cancer screening -PSA (03/2023) 0.8 -offered prostate cancer screening to the patient explaining that individuals w/ black ancestry, germline mutations, strong family history of prostate cancer and individuals receiving TRT per AUA 2023 guidelines   Chief Complaint  Patient presents with   Hypogonadism    HPI: Bobby Carter is a 42 y.o. male who presents today for 6 month follow up.   Previous records reviewed.  Having issues with ear infections.  No contraindications to testosterone replacement therapy.  He has no issues with urination.  Patient denies any modifying or aggravating factors.  Patient denies any recent UTI's, gross hematuria, dysuria or suprapubic/flank pain.  Patient denies any fevers, chills, nausea or vomiting.     IPSS     Row Name 04/09/23 0800         International Prostate Symptom Score   How often have you had the sensation of not emptying your bladder? Not at All     How often have you had to urinate less than every two hours? Not at All     How often have you found you stopped and started again several times when you urinated? Not at All     How often have you found it difficult to postpone urination? Not at All     How often have you had a weak urinary stream? Less than 1 in 5 times     How often have you had to strain to start urination? Not at All     How many times did you typically get up at night to urinate? 1 Time     Total IPSS Score 2       Quality of Life due to urinary symptoms   If you were to spend  the rest of your life with your urinary condition just the way it is now how would you feel about that? Pleased                Score:  1-7 Mild 8-19 Moderate 20-35 Severe   Patient still having occasional spontaneous erections.  He denies any pain or curvature with erections.  He has no issues with ejaculation.   SHIM     Row Name 04/09/23 0850         SHIM: Over the last 6 months:   How do you rate your confidence that you could get and keep an erection? Very High     When you had erections with sexual stimulation, how often were your erections hard enough for penetration (entering your partner)? Almost Always or Always     During sexual intercourse, how often were you able to maintain your erection after you had penetrated (entered) your partner? Almost Always or Always     During sexual intercourse, how difficult was it to maintain your erection to completion of intercourse? Not Difficult     When you attempted sexual intercourse, how often was it satisfactory for you? Almost Always or Always       SHIM Total Score   SHIM 25  Score: 1-7 Severe ED 8-11 Moderate ED 12-16 Mild-Moderate ED 17-21 Mild ED 22-25 No ED      PMH: Past Medical History:  Diagnosis Date   Migraines    Testosterone deficiency     Surgical History: Past Surgical History:  Procedure Laterality Date   VASECTOMY      Home Medications:  Allergies as of 04/09/2023   No Known Allergies      Medication List        Accurate as of April 09, 2023  9:04 AM. If you have any questions, ask your nurse or doctor.          albuterol 108 (90 Base) MCG/ACT inhaler Commonly known as: VENTOLIN HFA Inhale 2 puffs into the lungs every 6 (six) hours as needed for wheezing or shortness of breath.   amoxicillin 500 MG capsule Commonly known as: AMOXIL Take 2 capsules (1,000 mg total) by mouth 2 (two) times daily.   predniSONE 20 MG tablet Commonly known as: DELTASONE 3 tabs  by mouth daily x 3 days, then 2 tabs by mouth daily x 2 days then 1 tab by mouth daily x 2 days   Testosterone 20.25 MG/ACT (1.62%) Gel PLACE 2 ACT ONTO THE SKIN DAILY.        Allergies: No Known Allergies  Family History: Family History  Problem Relation Age of Onset   Hypertension Mother    Hypertension Father    Diabetes Maternal Grandfather     Social History:  reports that he has never smoked. He has never used smokeless tobacco. He reports current alcohol use of about 2.0 standard drinks of alcohol per week. He reports that he does not use drugs.  ROS: Pertinent ROS in HPI  Physical Exam: BP (!) 149/97   Pulse 94   Ht 5\' 7"  (1.702 m)   Wt 237 lb (107.5 kg)   BMI 37.12 kg/m   Constitutional:  Well nourished. Alert and oriented, No acute distress. HEENT: [place] AT, moist mucus membranes.  Trachea midline Cardiovascular: No clubbing, cyanosis, or edema. Respiratory: Normal respiratory effort, no increased work of breathing. Neurologic: Grossly intact, no focal deficits, moving all 4 extremities. Psychiatric: Normal mood and affect.   Laboratory Data: Results for orders placed or performed in visit on 04/03/23  PSA  Result Value Ref Range   Prostate Specific Ag, Serum 0.8 0.0 - 4.0 ng/mL  Hemoglobin and Hematocrit, Blood  Result Value Ref Range   Hemoglobin 15.2 13.0 - 17.7 g/dL   Hematocrit 40.9 81.1 - 51.0 %  I have reviewed the labs.   Pertinent Imaging: N/A  Assessment & Plan:    1. Testosterone deficiency  -at goal clinically -testosterone levels are pending  -Hemoglobin/hematocrit within normal parameters -continue Testosterone 20.25 MG/ACT (1.62%) GEL, 2 pumps daily  -When he returns in 6 months, he would like to switch back to the Testopel pellets  2. Prostate cancer screening -PSA normal, but just slightly above median PSA for age group -offered prostate cancer screening to the patient explaining that individuals w/ black ancestry, germline  mutations, strong family history of prostate cancer and individuals receiving TRT per AUA 2023 guidelines  -continue biannual screening   3. BPH with Mild LU TS -At goal with symptoms -Continue conservative management  Return in about 6 months (around 10/08/2023) for PSA, testosterone, H & H , IPSS, SHIM and exam.  These notes generated with voice recognition software. I apologize for typographical errors.  Cloretta Ned  [facility] [address]  [address] [place], [place] [address] [phone]

## 2023-04-09 ENCOUNTER — Ambulatory Visit: Payer: BC Managed Care – PPO | Admitting: Urology

## 2023-04-09 ENCOUNTER — Encounter: Payer: Self-pay | Admitting: Urology

## 2023-04-09 VITALS — BP 149/97 | HR 94 | Ht 67.0 in | Wt 237.0 lb

## 2023-04-09 DIAGNOSIS — N401 Enlarged prostate with lower urinary tract symptoms: Secondary | ICD-10-CM

## 2023-04-09 DIAGNOSIS — Z125 Encounter for screening for malignant neoplasm of prostate: Secondary | ICD-10-CM

## 2023-04-09 DIAGNOSIS — N138 Other obstructive and reflux uropathy: Secondary | ICD-10-CM

## 2023-04-09 DIAGNOSIS — E291 Testicular hypofunction: Secondary | ICD-10-CM

## 2023-04-10 LAB — TESTOSTERONE: Testosterone: 845 ng/dL (ref 264–916)

## 2023-08-30 DIAGNOSIS — E349 Endocrine disorder, unspecified: Secondary | ICD-10-CM

## 2023-08-30 NOTE — Telephone Encounter (Signed)
Medication pulled down for review. 

## 2023-09-01 MED ORDER — TESTOSTERONE 20.25 MG/ACT (1.62%) TD GEL
TRANSDERMAL | 1 refills | Status: DC
Start: 2023-09-01 — End: 2023-10-11

## 2023-09-17 NOTE — Telephone Encounter (Signed)
 My message about the medication was when patient wanted refill and I pend the medication for review so Bobby Carter can just sign it, that was on 08/30/23 and it was sent in.

## 2023-09-27 DIAGNOSIS — H9041 Sensorineural hearing loss, unilateral, right ear, with unrestricted hearing on the contralateral side: Secondary | ICD-10-CM | POA: Diagnosis not present

## 2023-09-27 DIAGNOSIS — H9311 Tinnitus, right ear: Secondary | ICD-10-CM | POA: Diagnosis not present

## 2023-09-27 DIAGNOSIS — J301 Allergic rhinitis due to pollen: Secondary | ICD-10-CM | POA: Diagnosis not present

## 2023-10-08 ENCOUNTER — Other Ambulatory Visit: Payer: BC Managed Care – PPO

## 2023-10-08 DIAGNOSIS — Z125 Encounter for screening for malignant neoplasm of prostate: Secondary | ICD-10-CM

## 2023-10-08 DIAGNOSIS — E291 Testicular hypofunction: Secondary | ICD-10-CM

## 2023-10-08 DIAGNOSIS — N138 Other obstructive and reflux uropathy: Secondary | ICD-10-CM | POA: Diagnosis not present

## 2023-10-08 DIAGNOSIS — N401 Enlarged prostate with lower urinary tract symptoms: Secondary | ICD-10-CM | POA: Diagnosis not present

## 2023-10-09 LAB — HEMOGLOBIN AND HEMATOCRIT, BLOOD
Hematocrit: 46.3 % (ref 37.5–51.0)
Hemoglobin: 15.1 g/dL (ref 13.0–17.7)

## 2023-10-09 LAB — TESTOSTERONE: Testosterone: 409 ng/dL (ref 264–916)

## 2023-10-09 LAB — PSA: Prostate Specific Ag, Serum: 0.7 ng/mL (ref 0.0–4.0)

## 2023-10-10 NOTE — Progress Notes (Signed)
 10/11/2023 8:24 AM   [name] 05/10/1981 [id number]  Referring provider: Doreene Nest, NP [address] [place],  [place] [address]  Urological history: 1. 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 Testosterone deficiency -contributing factors of age -testosterone level (10/2023) 409 -Hemoglobin/hematocrit (10/2023) 15.1/46.3 -testosterone gel (1.62%), 2 pumps daily   3. Prostate cancer screening -PSA (10/2023) 0.7 -offered prostate cancer screening to the patient explaining that individuals w/ black ancestry, germline mutations, strong family history of prostate cancer and individuals receiving TRT per AUA 2023 guidelines   Chief Complaint  Patient presents with   Benign Prostatic Hypertrophy   Hypogonadism    HPI: Bobby Carter is a 43 y.o. male who presents today for 6 month follow up.   Previous records reviewed.    He feels well on the gel.  He had been out of it for a week secondary insurance issue and during that time, he experienced a decrease in energy and motivation.   He is wanting to stay on the gel and not restart Testopel.    I PSS 2/0  He has not urinary complaints.  Patient denies any modifying or aggravating factors.  Patient denies any recent UTI's, gross hematuria, dysuria or suprapubic/flank pain.  Patient denies any fevers, chills, nausea or vomiting.     IPSS     Row Name 10/11/23 0900         International Prostate Symptom Score   How often have you had the sensation of not emptying your bladder? Less than 1 in 5     How often have you had to urinate less than every two hours? Not at All     How often have you found you stopped and started again several times when you urinated? Not at All     How often have you found it difficult to postpone urination? Not at All     How often have you had a weak urinary stream? Less than 1 in 5 times     How often have you had to strain to start  urination? Not at All     How many times did you typically get up at night to urinate? None     Total IPSS Score 2       Quality of Life due to urinary symptoms   If you were to spend the rest of your life with your urinary condition just the way it is now how would you feel about that? Delighted             Score:  1-7 Mild 8-19 Moderate 20-35 Severe   SHIM 25   Patient still having spontaneous erections.  He denies any pain or curvature with erections.     SHIM     Row Name 10/11/23 0931         SHIM: Over the last 6 months:   How do you rate your confidence that you could get and keep an erection? Very High     When you had erections with sexual stimulation, how often were your erections hard enough for penetration (entering your partner)? Almost Always or Always     During sexual intercourse, how often were you able to maintain your erection after you had penetrated (entered) your partner? Almost Always or Always     During sexual intercourse, how difficult was it to maintain your erection to completion of intercourse? Not Difficult  When you attempted sexual intercourse, how often was it satisfactory for you? Almost Always or Always       SHIM Total Score   SHIM 25              Score: 1-7 Severe ED 8-11 Moderate ED 12-16 Mild-Moderate ED 17-21 Mild ED 22-25 No ED      PMH: Past Medical History:  Diagnosis Date   Migraines    Testosterone deficiency     Surgical History: Past Surgical History:  Procedure Laterality Date   VASECTOMY      Home Medications:  Allergies as of 10/11/2023   No Known Allergies      Medication List        Accurate as of October 11, 2023 11:59 PM. If you have any questions, ask your nurse or doctor.          STOP taking these medications    albuterol 108 (90 Base) MCG/ACT inhaler Commonly known as: VENTOLIN HFA   amoxicillin 500 MG capsule Commonly known as: AMOXIL   predniSONE 20 MG tablet Commonly  known as: DELTASONE       TAKE these medications    Testosterone 20.25 MG/ACT (1.62%) Gel PLACE 2 ACT ONTO THE SKIN DAILY.        Allergies: No Known Allergies  Family History: Family History  Problem Relation Age of Onset   Hypertension Mother    Hypertension Father    Diabetes Maternal Grandfather     Social History:  reports that he has never smoked. He has never used smokeless tobacco. He reports current alcohol use of about 2.0 standard drinks of alcohol per week. He reports that he does not use drugs.  ROS: Pertinent ROS in HPI  Physical Exam: BP (!) 148/84   Pulse 97   Ht 5\' 9"  (1.753 m)   Wt 237 lb (107.5 kg)   BMI 35.00 kg/m   Constitutional:  Well nourished. Alert and oriented, No acute distress. HEENT: [place] AT, moist mucus membranes.  Trachea midline Cardiovascular: No clubbing, cyanosis, or edema. Respiratory: Normal respiratory effort, no increased work of breathing. Neurologic: Grossly intact, no focal deficits, moving all 4 extremities. Psychiatric: Normal mood and affect.   Laboratory Data: Results for orders placed or performed in visit on 10/08/23  PSA   Collection Time: 10/08/23  8:11 AM  Result Value Ref Range   Prostate Specific Ag, Serum 0.7 0.0 - 4.0 ng/mL  Testosterone   Collection Time: 10/08/23  8:11 AM  Result Value Ref Range   Testosterone 409 264 - 916 ng/dL  Hemoglobin and Hematocrit, Blood   Collection Time: 10/08/23  8:11 AM  Result Value Ref Range   Hemoglobin 15.1 13.0 - 17.7 g/dL   Hematocrit 14.7 82.9 - 51.0 %  I have reviewed the labs.   Pertinent Imaging: N/A  Assessment & Plan:    1. Hypogonadism  -at goal clinically -testosterone levels are slightly subtherapeutic -Hemoglobin/hematocrit within normal parameters -continue Testosterone 20.25 MG/ACT (1.62%) GEL, 2 pumps daily  -he will return in 6 months for testosterone, hemoglobin, hematocrit, PSA, I PSS and SHIM  2. Prostate cancer screening -PSA  stable -continue biannual screening   3. BPH with Mild LU TS -At goal with symptoms -Continue conservative management  Return in about 6 months (around 04/11/2024) for PSA, testosterone, H & H, IPSS, SHIM and exam.  These notes generated with voice recognition software. I apologize for typographical errors.  Cloretta Ned  [facility]  [address]  [address] [place], [place] [address] [phone]

## 2023-10-11 ENCOUNTER — Ambulatory Visit: Payer: Self-pay | Admitting: Urology

## 2023-10-11 VITALS — BP 148/84 | HR 97 | Ht 69.0 in | Wt 237.0 lb

## 2023-10-11 DIAGNOSIS — Z125 Encounter for screening for malignant neoplasm of prostate: Secondary | ICD-10-CM

## 2023-10-11 DIAGNOSIS — N138 Other obstructive and reflux uropathy: Secondary | ICD-10-CM

## 2023-10-11 DIAGNOSIS — N401 Enlarged prostate with lower urinary tract symptoms: Secondary | ICD-10-CM

## 2023-10-11 DIAGNOSIS — E291 Testicular hypofunction: Secondary | ICD-10-CM

## 2023-10-11 DIAGNOSIS — E349 Endocrine disorder, unspecified: Secondary | ICD-10-CM | POA: Diagnosis not present

## 2023-10-11 MED ORDER — TESTOSTERONE 20.25 MG/ACT (1.62%) TD GEL: TRANSDERMAL | 5 refills | Status: DC

## 2023-10-13 ENCOUNTER — Encounter: Payer: Self-pay | Admitting: Urology

## 2023-12-27 ENCOUNTER — Encounter: Payer: Self-pay | Admitting: Primary Care

## 2023-12-27 ENCOUNTER — Ambulatory Visit: Payer: Self-pay | Admitting: Primary Care

## 2023-12-27 ENCOUNTER — Ambulatory Visit (INDEPENDENT_AMBULATORY_CARE_PROVIDER_SITE_OTHER): Admitting: Primary Care

## 2023-12-27 VITALS — BP 148/62 | HR 107 | Temp 97.1°F | Ht 69.0 in | Wt 235.0 lb

## 2023-12-27 DIAGNOSIS — E349 Endocrine disorder, unspecified: Secondary | ICD-10-CM

## 2023-12-27 DIAGNOSIS — I10 Essential (primary) hypertension: Secondary | ICD-10-CM

## 2023-12-27 DIAGNOSIS — Z0000 Encounter for general adult medical examination without abnormal findings: Secondary | ICD-10-CM

## 2023-12-27 LAB — LIPID PANEL
Cholesterol: 142 mg/dL (ref 0–200)
HDL: 34.4 mg/dL — ABNORMAL LOW (ref >39.00)
LDL Cholesterol: 80 mg/dL (ref 0–99)
NonHDL: 107.37
Total CHOL/HDL Ratio: 4
Triglycerides: 138 mg/dL (ref 0.0–149.0)
VLDL: 27.6 mg/dL (ref 0.0–40.0)

## 2023-12-27 LAB — COMPREHENSIVE METABOLIC PANEL WITH GFR
ALT: 40 U/L (ref 0–53)
AST: 24 U/L (ref 0–37)
Albumin: 5 g/dL (ref 3.5–5.2)
Alkaline Phosphatase: 41 U/L (ref 39–117)
BUN: 18 mg/dL (ref 6–23)
CO2: 26 meq/L (ref 19–32)
Calcium: 9.4 mg/dL (ref 8.4–10.5)
Chloride: 103 meq/L (ref 96–112)
Creatinine, Ser: 1.31 mg/dL (ref 0.40–1.50)
GFR: 66.51 mL/min (ref >60.00)
Glucose, Bld: 98 mg/dL (ref 70–99)
Potassium: 4 meq/L (ref 3.5–5.1)
Sodium: 138 meq/L (ref 135–145)
Total Bilirubin: 0.6 mg/dL (ref 0.2–1.2)
Total Protein: 7.3 g/dL (ref 6.0–8.3)

## 2023-12-27 NOTE — Progress Notes (Signed)
 Subjective:    Patient ID: Bobby Carter, male    DOB: 1981-06-02, 43 y.o.   MRN: [id number]  HPI  Bryam Brummond is a very pleasant 43 y.o. male who presents today for complete physical and follow up of chronic conditions.  Immunizations: -Tetanus: Completed in   Diet: Fair diet.  Exercise: Regular exercise  Eye exam: Completes annually  Dental exam: Completes semi-annually    BP Readings from Last 3 Encounters:  12/27/23 (!) 148/62  10/11/23 (!) 148/84  04/09/23 (!) 149/97   He does not check BP at home. He denies chest pain, dizziness, headaches. He exercises 6 days weekly at the gym for 1-2 hours per session.   Diet currently consists of:  Breakfast: Yogurt, fruit Lunch: Skips, sometimes protein/veggie, sometimes take out food. Dinner: Protein, vegetable, starch - home cooked meals Snacks: None Desserts: Infrequently  Beverages: Water mostly   Over the last few months he's purposefully tried to lose weight through healthy diet and regular exercise. He's frustrated at his lack of weight loss. He is losing inches to his waistline.   Wt Readings from Last 3 Encounters:  12/27/23 235 lb (106.6 kg)  10/11/23 237 lb (107.5 kg)  04/09/23 237 lb (107.5 kg)        Review of Systems  Constitutional:  Negative for unexpected weight change.  HENT:  Negative for rhinorrhea.   Respiratory:  Negative for cough and shortness of breath.   Cardiovascular:  Negative for chest pain.  Gastrointestinal:  Negative for constipation and diarrhea.  Genitourinary:  Negative for difficulty urinating.  Musculoskeletal:  Negative for arthralgias and myalgias.  Skin:  Negative for rash.  Allergic/Immunologic: Negative for environmental allergies.  Neurological:  Negative for dizziness, numbness and headaches.  Psychiatric/Behavioral:  The patient is not nervous/anxious.          Past Medical History:  Diagnosis Date   Migraines    Right otitis media with effusion 03/28/2023    Testosterone  deficiency     Social History   Socioeconomic History   Marital status: Married    Spouse name: Gina Cosby   Number of children: 2   Years of education: Not on file   Highest education level: Bachelor's degree (e.g., BA, AB, BS)  Occupational History   Occupation: Firefighter    Comment: [employer]  Tobacco Use   Smoking status: Never   Smokeless tobacco: Never  Substance and Sexual Activity   Alcohol use: Yes    Alcohol/week: 2.0 standard drinks of alcohol    Types: 2 Cans of beer per week   Drug use: Never   Sexual activity: Yes    Birth control/protection: Surgical    Comment: Vasectomy  Other Topics Concern   Not on file  Social History Narrative   Not on file   Social Drivers of Health   Financial Resource Strain: Low Risk  (12/21/2023)   Overall Financial Resource Strain (CARDIA)    Difficulty of Paying Living Expenses: Not hard at all  Food Insecurity: No Food Insecurity (12/21/2023)   Hunger Vital Sign    Worried About Running Out of Food in the Last Year: Never true    Ran Out of Food in the Last Year: Never true  Transportation Needs: No Transportation Needs (12/21/2023)   PRAPARE - Administrator, Civil Service (Medical): No    Lack of Transportation (Non-Medical): No  Physical Activity: Sufficiently Active (12/21/2023)   Exercise Vital Sign    Days of  Exercise per Week: 6 days    Minutes of Exercise per Session: 60 min  Stress: No Stress Concern Present (12/21/2023)   [employer] of Occupational Health - Occupational Stress Questionnaire    Feeling of Stress: Not at all  Social Connections: Unknown (12/21/2023)   Social Connection and Isolation Panel    Frequency of Communication with Friends and Family: More than three times a week    Frequency of Social Gatherings with Friends and Family: Twice a week    Attends Religious Services: Never    Diplomatic Services operational officer: Patient declined     Attends Engineer, structural: Not on file    Marital Status: Married  Catering manager Violence: Not on file    Past Surgical History:  Procedure Laterality Date   VASECTOMY      Family History  Problem Relation Age of Onset   Hypertension Mother    Hypertension Father    Diabetes Maternal Grandfather     No Known Allergies  Current Outpatient Medications on File Prior to Visit  Medication Sig Dispense Refill   Testosterone  20.25 MG/ACT (1.62%) GEL PLACE 2 ACT ONTO THE SKIN DAILY. 175 g 5   No current facility-administered medications on file prior to visit.    BP (!) 148/62   Pulse (!) 107   Temp (!) 97.1 F (36.2 C) (Temporal)   Ht 5' 9 (1.753 m)   Wt 235 lb (106.6 kg)   SpO2 97%   BMI 34.70 kg/m  Objective:   Physical Exam HENT:     Right Ear: Tympanic membrane and ear canal normal.     Left Ear: Tympanic membrane and ear canal normal.   Eyes:     Pupils: Pupils are equal, round, and reactive to light.    Cardiovascular:     Rate and Rhythm: Normal rate and regular rhythm.  Pulmonary:     Effort: Pulmonary effort is normal.     Breath sounds: Normal breath sounds.  Abdominal:     General: Bowel sounds are normal.     Palpations: Abdomen is soft.     Tenderness: There is no abdominal tenderness.   Musculoskeletal:        General: Normal range of motion.     Cervical back: Neck supple.   Skin:    General: Skin is warm and dry.   Neurological:     Mental Status: He is alert and oriented to person, place, and time.     Cranial Nerves: No cranial nerve deficit.     Deep Tendon Reflexes:     Reflex Scores:      Patellar reflexes are 2+ on the right side and 2+ on the left side.  Psychiatric:        Mood and Affect: Mood normal.           Assessment & Plan:  Preventative health care Assessment & Plan: Declines tetanus vaccine.  Discussed the importance of a healthy diet and regular exercise in order for weight loss, and to  reduce the risk of further co-morbidity.  Exam stable. Labs pending.  Follow up in 1 year for repeat physical.   Orders: -     Lipid panel -     Comprehensive metabolic panel with GFR  Primary hypertension Assessment & Plan: Above goal today, also try numerous office visits over the last several years.  Given family history, coupled with his healthy lifestyle and elevated readings, I recommended treatment today with  medication. He declines and would like to start monitoring his blood pressure at home.  He will send blood pressure readings via MyChart in 2 weeks.  We did discuss the long-term effects of untreated hypertension.  Orders: -     Lipid panel -     Comprehensive metabolic panel with GFR  Testosterone  deficiency Assessment & Plan: Follow with urology, office notes and labs reviewed from April 2025. Continue testosterone  gel as prescribed.         Cloyce Paterson K Akaiya Touchette, NP

## 2023-12-27 NOTE — Patient Instructions (Signed)
Stop by the lab prior to leaving today. I will notify you of your results once received.   Start monitoring your blood pressure daily, around the same time of day, for the next 2-3 weeks.  Ensure that you have rested for 30 minutes prior to checking your blood pressure.   Record your readings and notify me if you see numbers consistently at or above 140 on top and/or 90 on bottom.  It was a pleasure to see you today!

## 2023-12-27 NOTE — Assessment & Plan Note (Signed)
 Declines tetanus vaccine.  Discussed the importance of a healthy diet and regular exercise in order for weight loss, and to reduce the risk of further co-morbidity.  Exam stable. Labs pending.  Follow up in 1 year for repeat physical.

## 2023-12-27 NOTE — Assessment & Plan Note (Signed)
 Above goal today, also try numerous office visits over the last several years.  Given family history, coupled with his healthy lifestyle and elevated readings, I recommended treatment today with medication. He declines and would like to start monitoring his blood pressure at home.  He will send blood pressure readings via MyChart in 2 weeks.  We did discuss the long-term effects of untreated hypertension.

## 2023-12-27 NOTE — Assessment & Plan Note (Signed)
 Follow with urology, office notes and labs reviewed from April 2025. Continue testosterone  gel as prescribed.

## 2024-04-09 ENCOUNTER — Other Ambulatory Visit

## 2024-04-09 DIAGNOSIS — E291 Testicular hypofunction: Secondary | ICD-10-CM

## 2024-04-09 DIAGNOSIS — N138 Other obstructive and reflux uropathy: Secondary | ICD-10-CM | POA: Diagnosis not present

## 2024-04-09 DIAGNOSIS — N401 Enlarged prostate with lower urinary tract symptoms: Secondary | ICD-10-CM | POA: Diagnosis not present

## 2024-04-10 LAB — TESTOSTERONE: Testosterone: 262 ng/dL — ABNORMAL LOW (ref 264–916)

## 2024-04-10 LAB — HEMOGLOBIN AND HEMATOCRIT, BLOOD
Hematocrit: 46.1 % (ref 37.5–51.0)
Hemoglobin: 14.1 g/dL (ref 13.0–17.7)

## 2024-04-10 LAB — PSA: Prostate Specific Ag, Serum: 0.9 ng/mL (ref 0.0–4.0)

## 2024-04-13 ENCOUNTER — Other Ambulatory Visit: Payer: Self-pay | Admitting: Urology

## 2024-04-13 DIAGNOSIS — E349 Endocrine disorder, unspecified: Secondary | ICD-10-CM

## 2024-04-13 NOTE — Progress Notes (Unsigned)
 04/15/2024 9:52 AM   Bobby Carter May 12, 1981 [id number]  Referring provider: Gretta Comer POUR, NP [address] [place],  [place] [address]  Urological history: 1. 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 Hypogonadism -contributing factors of age -testosterone  level (04/2024) 262 -Hemoglobin/hematocrit (04/2024) 14.1/46.1 -testosterone  gel (1.62%), 2 pumps daily   3. Prostate cancer screening -PSA (04/2024) 0.9 -offered prostate cancer screening to the patient explaining that individuals w/ black ancestry, germline mutations, strong family history of prostate cancer and individuals receiving TRT per AUA 2023 guidelines   Chief Complaint  Patient presents with   Medical Management of Chronic Issues    HPI: Bobby Carter is a 43 y.o. male who presents today for 6 month follow up.   Previous records reviewed.    He reports good adherence to testosterone  gel 20.25 mg/ACT (1.62%) GEL, 2 pumps daily.  Denies new complaints of low libido, erectile dysfunction, fatigue, or mood changes.  No complaints of gynecomastia, visual changes, or thromboembolic symptoms.  Energy level, libido and overall sense of wellbeing being reported as stable/ improved compared to prior visit.    He would like to change treatment modalities as he finds the gel cumbersome and he is worried about transference issues.   Testosterone  level 262  Hemoglobin/hematocrit 14.1/46.1  Liver enzymes (12/2023) WNL   I PSS 2/0  He has no bothersome issues with voiding.  Patient denies any modifying or aggravating factors.  Patient denies any recent UTI's, gross hematuria, dysuria or suprapubic/flank pain.  Patient denies any fevers, chills, nausea or vomiting.    PSA 0.9  Serum creatinine (12/2023) 1.31, eGFR 66.51  SHIM 25  He is having satisfactory erections and intercourse.  Patient still having spontaneous erections.  He denies any pain or curvature  with erections.  He has no issues with ejaculation.   Testosterone  level 262  Cholesterol (12/2023) 142  PMH: Past Medical History:  Diagnosis Date   Migraines    Right otitis media with effusion 03/28/2023   Testosterone  deficiency     Surgical History: Past Surgical History:  Procedure Laterality Date   VASECTOMY      Home Medications:  Allergies as of 04/15/2024   No Known Allergies      Medication List        Accurate as of April 15, 2024  9:52 AM. If you have any questions, ask your nurse or doctor.          Testosterone  20.25 MG/ACT (1.62%) Gel PLACE 2 PUMPS ONTO THE SKIN DAILY.   testosterone  cypionate 200 MG/ML injection Commonly known as: DEPOTESTOSTERONE CYPIONATE Inject 1 mL (200 mg total) into the muscle every 14 (fourteen) days.        Allergies: No Known Allergies  Family History: Family History  Problem Relation Age of Onset   Hypertension Mother    Hypertension Father    Diabetes Maternal Grandfather     Social History:  reports that he has never smoked. He has never used smokeless tobacco. He reports current alcohol use of about 2.0 standard drinks of alcohol per week. He reports that he does not use drugs.  ROS: Pertinent ROS in HPI  Physical Exam: BP (!) 153/95   Pulse 91   Ht 5' 9 (1.753 m)   Wt 225 lb (102.1 kg)   BMI 33.23 kg/m   Constitutional:  Well nourished. Alert and oriented, No acute distress. HEENT: [place] AT, moist mucus membranes.  Trachea  midline Cardiovascular: No clubbing, cyanosis, or edema. Respiratory: Normal respiratory effort, no increased work of breathing. Neurologic: Grossly intact, no focal deficits, moving all 4 extremities. Psychiatric: Normal mood and affect.   Laboratory Data: See Epic and HPI I have reviewed the labs.   Pertinent Imaging: N/A  Assessment & Plan:    1. Hypogonadism  -at goal clinically -testosterone  levels are subtherapeutic -Hemoglobin/hematocrit within normal  parameters -discontinue Testosterone  20.25 MG/ACT (1.62%) GEL, 2 pumps daily  -start testosterone  cypionate 200 mg/mL, 1 cc every 14 days  -Will schedule an appointment to be instructed on injections  2. Prostate cancer screening -PSA stable -continue biannual screening   3. BPH with Mild LU TS -At goal with symptoms -Continue conservative management  Return for schedule IM injection training .  These notes generated with voice recognition software. I apologize for typographical errors.  CLOTILDA HELON RIGGERS  [facility] [address]  [address] [place], [place] [address] [phone]

## 2024-04-15 ENCOUNTER — Encounter: Payer: Self-pay | Admitting: Urology

## 2024-04-15 ENCOUNTER — Ambulatory Visit: Admitting: Urology

## 2024-04-15 VITALS — BP 153/95 | HR 91 | Ht 69.0 in | Wt 225.0 lb

## 2024-04-15 DIAGNOSIS — N401 Enlarged prostate with lower urinary tract symptoms: Secondary | ICD-10-CM | POA: Diagnosis not present

## 2024-04-15 DIAGNOSIS — E291 Testicular hypofunction: Secondary | ICD-10-CM

## 2024-04-15 DIAGNOSIS — N138 Other obstructive and reflux uropathy: Secondary | ICD-10-CM | POA: Diagnosis not present

## 2024-04-15 DIAGNOSIS — Z125 Encounter for screening for malignant neoplasm of prostate: Secondary | ICD-10-CM | POA: Diagnosis not present

## 2024-04-15 MED ORDER — TESTOSTERONE CYPIONATE 200 MG/ML IM SOLN: 200.0000 mg | INTRAMUSCULAR | 0 refills | Status: AC

## 2024-05-04 NOTE — Progress Notes (Unsigned)
   Urological history: 1. Undesired infertility -s/p vasectomy 01/18/2021  -post vas specimen negative for sperm   2. Hypogonadism -contributing factors of age -testosterone  level (04/2024) 262 -Hemoglobin/hematocrit (04/2024) 14.1/46.1 -testosterone  gel (1.62%), 2 pumps daily    3. Prostate cancer screening -PSA (04/2024) 0.9 -offered prostate cancer screening to the patient explaining that individuals w/ black ancestry, germline mutations, strong family history of prostate cancer and individuals receiving TRT per AUA 2023 guidelines   Bobby Carter is a 43 y.o.presents today for education on self administration of testosterone  injections.  He has been prescribed testosterone  cypionate 200 mg/milliliters for the treatment of hypogonadism.  He expresses interest in administering the injections at home and has had no prior experience with intramuscular or subcutaneous injections.  He is motivated to learn and asks appropriate questions.  Physical exam: Vitals:   05/06/24 0847  BP: (!) 163/100  Pulse: 74   Constitutional:  Well nourished. Alert and oriented, No acute distress. Psychiatric: Normal mood and affect.  No physical limitations noted that would interfere with self injections  Assessment: Hypogonadism-on testosterone  replacement therapy.  Needs instruction on safe for proper self injection technique.  Plan: - Reviewed and demonstrated the following with the patient: Hand hygiene Drawn up medication with 18-gauge needle Switching to 23-gauge needle for injection Injections identification (gluteal or lateral thigh for IM: abdomen or thigh for SQ) Proper technique, angle, and depth Needle disposal sharps container Patient then performed return demonstration successfully with verbal guidance Provided written instructions and answered all questions Encouraged patient to call with any concerns or if unsure about future injections Will continue testosterone  therapy as  prescribed Follow-up testosterone  level, hemoglobin and hematocrit in 9 weeks, one week after an injection

## 2024-05-06 ENCOUNTER — Ambulatory Visit: Admitting: Urology

## 2024-05-06 VITALS — BP 163/100 | HR 74 | Ht 69.0 in | Wt 225.0 lb

## 2024-05-06 DIAGNOSIS — E291 Testicular hypofunction: Secondary | ICD-10-CM

## 2024-05-06 MED ORDER — TESTOSTERONE CYPIONATE 200 MG/ML IM SOLN
200.0000 mg | Freq: Once | INTRAMUSCULAR | Status: AC
  Administered 2024-05-06: 200 mg via INTRAMUSCULAR

## 2024-06-29 ENCOUNTER — Other Ambulatory Visit: Payer: Self-pay

## 2024-06-29 DIAGNOSIS — E349 Endocrine disorder, unspecified: Secondary | ICD-10-CM

## 2024-06-29 DIAGNOSIS — N138 Other obstructive and reflux uropathy: Secondary | ICD-10-CM

## 2024-06-29 DIAGNOSIS — Z125 Encounter for screening for malignant neoplasm of prostate: Secondary | ICD-10-CM

## 2024-06-30 ENCOUNTER — Other Ambulatory Visit

## 2024-06-30 DIAGNOSIS — E349 Endocrine disorder, unspecified: Secondary | ICD-10-CM

## 2024-06-30 DIAGNOSIS — N138 Other obstructive and reflux uropathy: Secondary | ICD-10-CM

## 2024-06-30 DIAGNOSIS — Z125 Encounter for screening for malignant neoplasm of prostate: Secondary | ICD-10-CM

## 2024-07-01 ENCOUNTER — Ambulatory Visit: Payer: Self-pay | Admitting: Urology

## 2024-07-01 LAB — TESTOSTERONE: Testosterone: 126 ng/dL — ABNORMAL LOW (ref 264–916)

## 2024-07-01 LAB — HEMOGLOBIN AND HEMATOCRIT, BLOOD
Hematocrit: 46.3 % (ref 37.5–51.0)
Hemoglobin: 14.4 g/dL (ref 13.0–17.7)

## 2024-07-08 ENCOUNTER — Other Ambulatory Visit: Payer: Self-pay

## 2024-07-08 DIAGNOSIS — E349 Endocrine disorder, unspecified: Secondary | ICD-10-CM

## 2024-09-30 ENCOUNTER — Other Ambulatory Visit
